# Patient Record
Sex: Male | Born: 1975 | Race: White | Hispanic: No | Marital: Married | Smoking: Never smoker
Health system: Southern US, Community
[De-identification: ages and names within clinical notes are randomized; demographics above are authoritative.]

## PROBLEM LIST (undated history)

## (undated) DIAGNOSIS — N200 Calculus of kidney: Secondary | ICD-10-CM

## (undated) DIAGNOSIS — G4730 Sleep apnea, unspecified: Secondary | ICD-10-CM

---

## 2015-07-07 ENCOUNTER — Encounter (HOSPITAL_COMMUNITY): Payer: Self-pay

## 2015-07-07 ENCOUNTER — Emergency Department (HOSPITAL_COMMUNITY)
Admission: EM | Admit: 2015-07-07 | Discharge: 2015-07-07 | Disposition: A | Discharge status: Home / Self Care | Payer: BLUE CROSS/BLUE SHIELD | Attending: Emergency Medicine | Admitting: Emergency Medicine

## 2015-07-07 ENCOUNTER — Emergency Department (HOSPITAL_COMMUNITY): Payer: BLUE CROSS/BLUE SHIELD

## 2015-07-07 DIAGNOSIS — N201 Calculus of ureter: Secondary | ICD-10-CM | POA: Insufficient documentation

## 2015-07-07 DIAGNOSIS — R109 Unspecified abdominal pain: Secondary | ICD-10-CM | POA: Diagnosis present

## 2015-07-07 DIAGNOSIS — Z87442 Personal history of urinary calculi: Secondary | ICD-10-CM | POA: Insufficient documentation

## 2015-07-07 DIAGNOSIS — Z8669 Personal history of other diseases of the nervous system and sense organs: Secondary | ICD-10-CM | POA: Diagnosis not present

## 2015-07-07 HISTORY — DX: Calculus of kidney: N20.0

## 2015-07-07 HISTORY — DX: Sleep apnea, unspecified: G47.30

## 2015-07-07 LAB — CBC WITH DIFFERENTIAL/PLATELET
Basophils Absolute: 0 10*3/uL (ref 0.0–0.1)
Basophils Relative: 0 %
EOS ABS: 0.3 10*3/uL (ref 0.0–0.7)
Eosinophils Relative: 3 %
HEMATOCRIT: 41.7 % (ref 39.0–52.0)
HEMOGLOBIN: 14 g/dL (ref 13.0–17.0)
LYMPHS ABS: 1.9 10*3/uL (ref 0.7–4.0)
Lymphocytes Relative: 16 %
MCH: 28.2 pg (ref 26.0–34.0)
MCHC: 33.6 g/dL (ref 30.0–36.0)
MCV: 84.1 fL (ref 78.0–100.0)
MONO ABS: 0.8 10*3/uL (ref 0.1–1.0)
MONOS PCT: 7 %
NEUTROS PCT: 74 %
Neutro Abs: 8.8 10*3/uL — ABNORMAL HIGH (ref 1.7–7.7)
Platelets: 307 10*3/uL (ref 150–400)
RBC: 4.96 MIL/uL (ref 4.22–5.81)
RDW: 12.9 % (ref 11.5–15.5)
WBC: 11.9 10*3/uL — ABNORMAL HIGH (ref 4.0–10.5)

## 2015-07-07 LAB — BASIC METABOLIC PANEL
Anion gap: 13 (ref 5–15)
BUN: 10 mg/dL (ref 6–20)
CALCIUM: 8.9 mg/dL (ref 8.9–10.3)
CHLORIDE: 104 mmol/L (ref 101–111)
CO2: 21 mmol/L — AB (ref 22–32)
CREATININE: 0.95 mg/dL (ref 0.61–1.24)
GFR calc non Af Amer: >60 mL/min (ref >60)
GLUCOSE: 114 mg/dL — AB (ref 65–99)
Potassium: 3.8 mmol/L (ref 3.5–5.1)
Sodium: 138 mmol/L (ref 135–145)

## 2015-07-07 LAB — URINALYSIS, ROUTINE W REFLEX MICROSCOPIC
BILIRUBIN URINE: NEGATIVE
GLUCOSE, UA: NEGATIVE mg/dL
HGB URINE DIPSTICK: NEGATIVE
KETONES UR: NEGATIVE mg/dL
Leukocytes, UA: NEGATIVE
NITRITE: NEGATIVE
PH: 6.5 (ref 5.0–8.0)
Protein, ur: NEGATIVE mg/dL
SPECIFIC GRAVITY, URINE: 1.016 (ref 1.005–1.030)

## 2015-07-07 MED ORDER — KETOROLAC TROMETHAMINE 30 MG/ML IJ SOLN
30.0000 mg | Freq: Once | INTRAMUSCULAR | Status: AC
  Administered 2015-07-07: 30 mg via INTRAVENOUS
  Filled 2015-07-07: qty 1

## 2015-07-07 MED ORDER — MORPHINE SULFATE (PF) 4 MG/ML IV SOLN
4.0000 mg | Freq: Once | INTRAVENOUS | Status: AC
  Administered 2015-07-07: 4 mg via INTRAVENOUS
  Filled 2015-07-07: qty 1

## 2015-07-07 MED ORDER — OXYCODONE-ACETAMINOPHEN 5-325 MG PO TABS: 1.0000 | ORAL_TABLET | ORAL | Status: DC | PRN

## 2015-07-07 MED ORDER — TAMSULOSIN HCL 0.4 MG PO CAPS: 0.4000 mg | ORAL_CAPSULE | Freq: Every day | ORAL | Status: DC

## 2015-07-07 NOTE — ED Notes (Signed)
Pt reports right sided flank pain with dysuria that began Thursday night.  Pt reports he has a hx of kidney stones and the symptoms feel similar.  Pt denies any hematuria at this time.

## 2015-07-07 NOTE — ED Notes (Signed)
Pt ambulated to the tteatment room with ease, no complaints. Pt changed into gown, and placed on monitor. Denny PeonErin, RN at bedside,

## 2015-07-07 NOTE — ED Provider Notes (Signed)
CSN: [id number]     Arrival date & time 07/07/15  0750 History   First MD Initiated Contact with Patient 07/07/15 [phone]     Chief Complaint  Patient presents with  . Nephrolithiasis     (Consider location/radiation/quality/duration/timing/severity/associated sxs/prior Treatment) HPI  40 year old male presents with right-sided back pain that started about 4 days ago. Started to radiate into the right groin and even into his right testicle. Started to feel better and thought that he was passing a kidney stone. However now the pain is worse since last night and still in his back and in his groin. Erler had testicular pain that is gone now. Has been having dysuria when this originally started, no dysuria for a couple days, and now dysuria when he most recently urinated in the ED. No hematuria. This feels very similar to when he passed a kidney stone about 1-2 months ago. Prior to this he had never had a kidney stone before. He never sought medical care but saw a stone pass out of his penis. No fevers. No nausea or vomiting. Has been taking ibuprofen with no relief. Pain is currently a 5-6.  Past Medical History  Diagnosis Date  . Sleep apnea   . Kidney stones    History reviewed. No pertinent past surgical history. History reviewed. No pertinent family history. Social History  Substance Use Topics  . Smoking status: Never Smoker   . Smokeless tobacco: None  . Alcohol Use: No    Review of Systems  Gastrointestinal: Positive for abdominal pain. Negative for nausea and vomiting.  Genitourinary: Positive for dysuria. Negative for hematuria.  Musculoskeletal: Positive for back pain.  All other systems reviewed and are negative.     Allergies  Review of patient's allergies indicates no known allergies.  Home Medications   Prior to Admission medications   Not on File   BP 160/93 mmHg  Pulse 78  Temp(Src) 97.8 F (36.6 C) (Oral)  Resp 18  Ht  (1.753 m)  Wt 215 lb (97.523 kg)   BMI 31.74 kg/m2  SpO2 98% Physical Exam  Constitutional: He is oriented to person, place, and time. He appears well-developed and well-nourished.  HENT:  Head: Normocephalic and atraumatic.  Right Ear: External ear normal.  Left Ear: External ear normal.  Nose: Nose normal.  Eyes: Right eye exhibits no discharge. Left eye exhibits no discharge.  Neck: Neck supple.  Cardiovascular: Normal rate, regular rhythm, normal heart sounds and intact distal pulses.   Pulmonary/Chest: Effort normal and breath sounds normal.  Abdominal: Soft. There is tenderness in the right lower quadrant. Hernia confirmed negative in the right inguinal area and confirmed negative in the left inguinal area.    Genitourinary: Penis normal. Right testis shows no swelling and no tenderness. Left testis shows no swelling and no tenderness.  Musculoskeletal: He exhibits no edema.       Thoracic back: He exhibits tenderness.       Back:  Neurological: He is alert and oriented to person, place, and time.  Skin: Skin is warm and dry.  Nursing note and vitals reviewed.   ED Course  Procedures (including critical care time) Labs Review Labs Reviewed  BASIC METABOLIC PANEL - Abnormal; Notable for the following:    CO2 21 (*)    Glucose, Bld 114 (*)    All other components within normal limits  CBC WITH DIFFERENTIAL/[id number] Abnormal; Notable for the following:    WBC 11.9 (*)    Neutro Abs  8.8 (*)    All other components within normal limits  URINALYSIS, ROUTINE W REFLEX MICROSCOPIC (NOT AT [facility])    Imaging Review Ct Renal Stone Study  07/07/2015  CLINICAL DATA:  RT FLANK PAIN, GROIN PRESSURE AND PAINFUL URINATION SINCE Thursday PAIN WORSENED THIS AM @ 5A H/O STONES IN PAST EXAM: CT ABDOMEN AND PELVIS WITHOUT CONTRAST TECHNIQUE: Multidetector CT imaging of the abdomen and pelvis was performed following the standard protocol without IV contrast. COMPARISON:  None. FINDINGS: Lower chest: No pulmonary nodules,  pleural effusions, or infiltrates. Heart size is normal. No imaged pericardial effusion or significant coronary artery calcifications. Upper abdomen: There is right-sided hydronephrosis and hydroureter secondary to a calculus in the distal right ureter which measures 5 mm in diameter. Calculus is approximately 5 cm proximal to the bladder. Multiple intrarenal stones are also identified in the right kidney, measuring 1-2 mm in diameter. The left kidney has a normal appearance. No intrarenal or ureteral stones are identified. No focal abnormality identified within the liver. Liver is diffusely low attenuation consistent with steatosis. No focal abnormality identified within the spleen, pancreas, or adrenal glands. Gallbladder is present. Gastrointestinal tract: The stomach and small bowel loops are normal in appearance. Colonic loops are normal in appearance. The appendix is well seen and has a normal appearance. Pelvis: Urinary bladder, prostate gland, and seminal vesicles are normal in appearance. No free pelvic fluid. Retroperitoneum: No evidence for aortic aneurysm. No retroperitoneal or mesenteric adenopathy. Abdominal wall: Unremarkable. Osseous structures: Unremarkable. IMPRESSION: 1. Right distal ureteral stone measuring 5 mm associated with right-sided hydronephrosis. 2. Multiple intrarenal calculi in the right kidney. 3. Hepatic steatosis. 4. Normal appendix. Electronically Signed   By: Norva PavlovElizabeth  Brown M.D.   On: 07/07/2015 09:44   I have personally reviewed and evaluated these images and lab results as part of my medical decision-making.   EKG Interpretation None      MDM   Final diagnoses:  Right ureteral stone    CT shows right ureteral stone. No evidence of UTI or acute infection. Appears comfortable and wants to go home. Given IV Toradol as well. Will start on Flomax, given a strainer, and treat with narcotics and continued ibuprofen at home. Follow-up with urology. Discussed strict  return precautions.    Pricilla LovelessScott Trella Thurmond, MD 07/07/15 1110

## 2016-12-07 IMAGING — CT CT RENAL STONE PROTOCOL
2 of 4 series · 16 of 46 positions shown, 18 images · non-contrast
Comparison: None.

CLINICAL DATA: RT FLANK PAIN, GROIN PRESSURE AND PAINFUL URINATION
SINCE [REDACTED] PAIN WORSENED THIS AM @ 5A H/O STONES IN PAST

EXAM:
CT ABDOMEN AND PELVIS WITHOUT CONTRAST
TECHNIQUE: Multidetector CT imaging of the abdomen and pelvis was performed
following the standard protocol without IV contrast.

[Series 2: renal stone 5mm · axial · 0.82mm/px · z∈[-893,-483]mm · 13 of 94 slices shown, 15 images]
[im 6/94  soft-tissue]
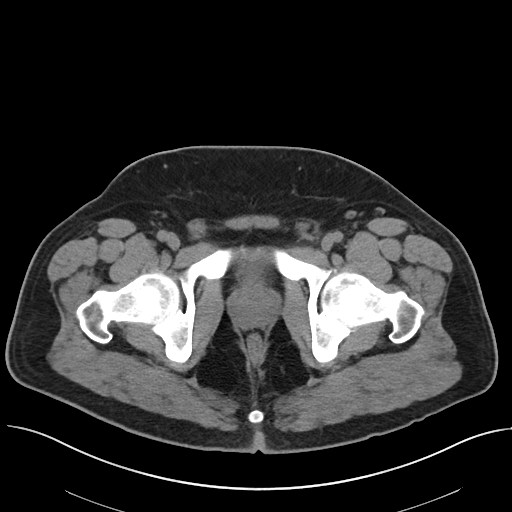
[im 6/94  bone]
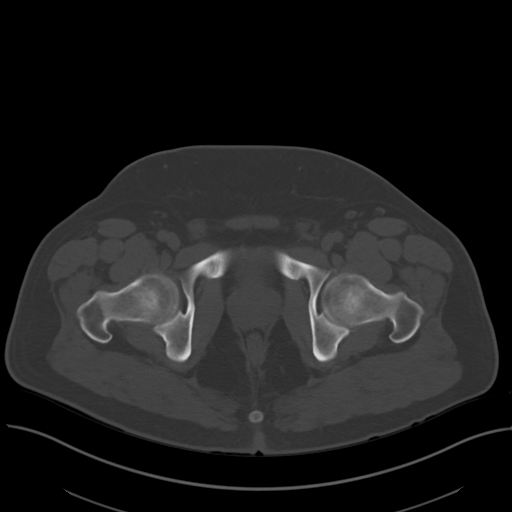
[im 12/94  soft-tissue]
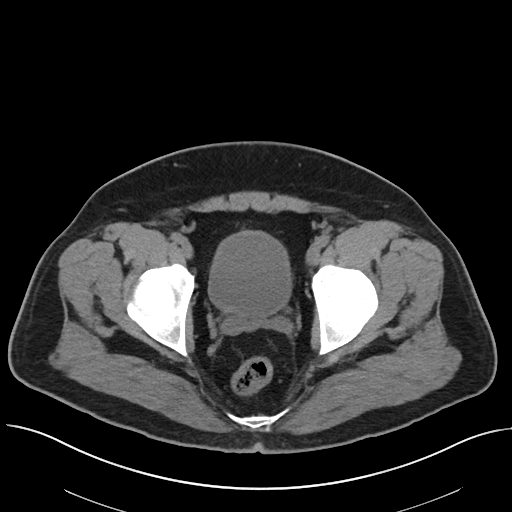
[im 18/94  soft-tissue]
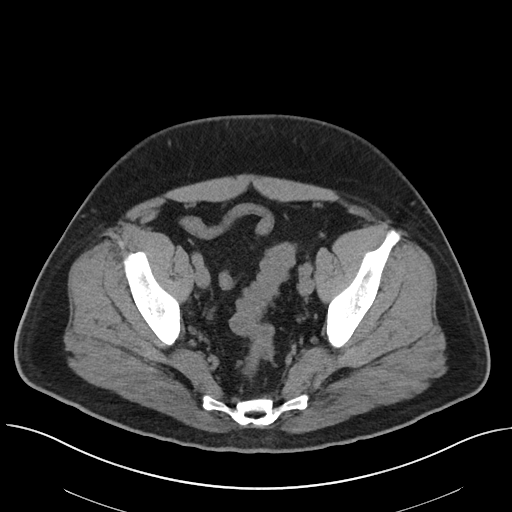
[im 30/94  soft-tissue]
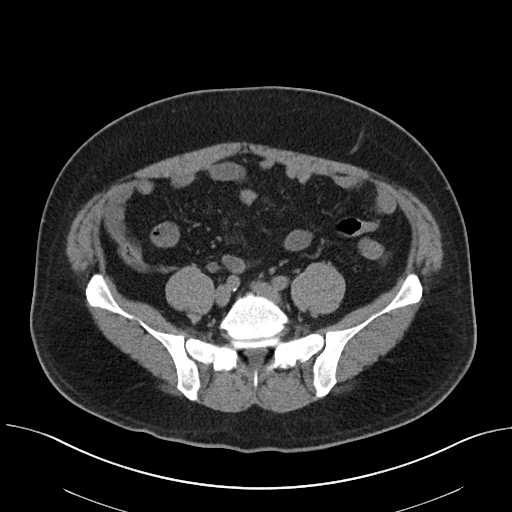
[im 35/94  soft-tissue]
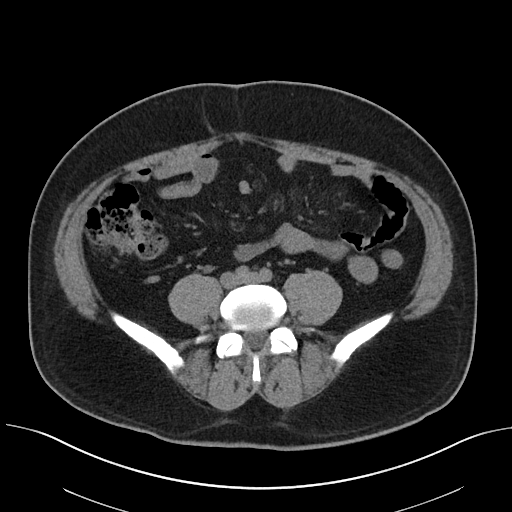
[im 41/94  soft-tissue]
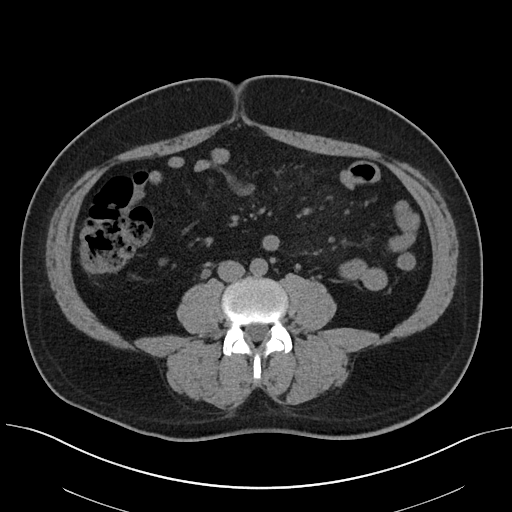
[im 47/94  soft-tissue]
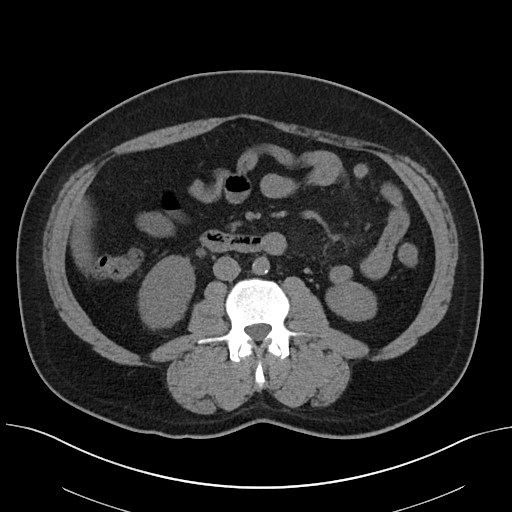
[im 53/94  soft-tissue]
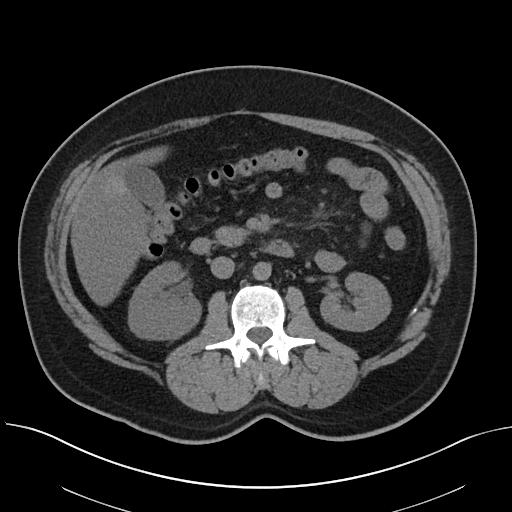
[im 59/94  soft-tissue]
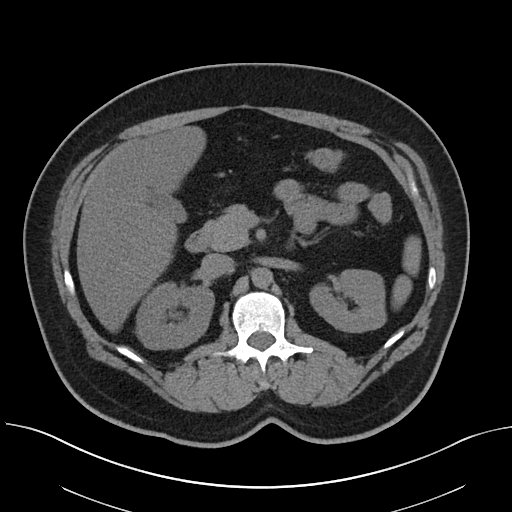
[im 59/94  bone]
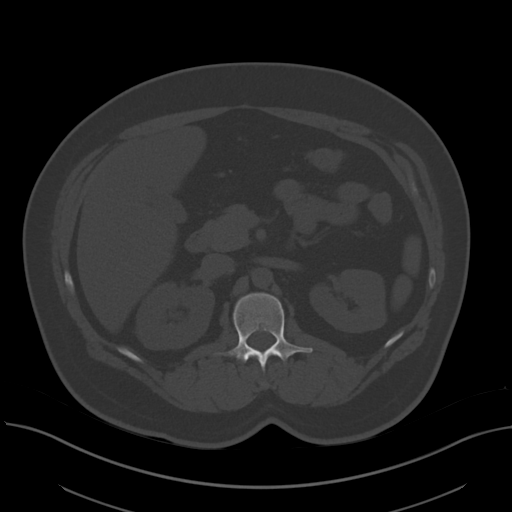
[im 64/94  soft-tissue]
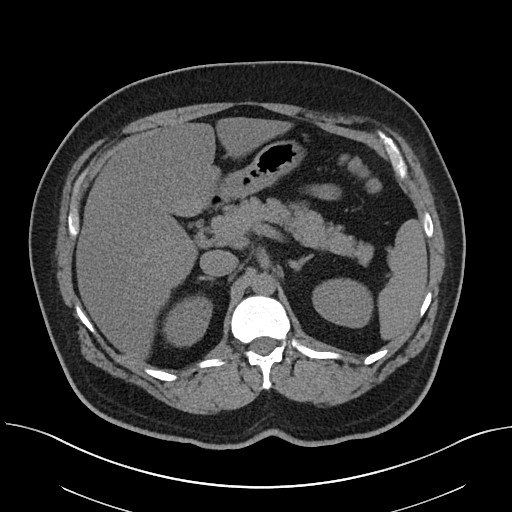
[im 76/94  soft-tissue]
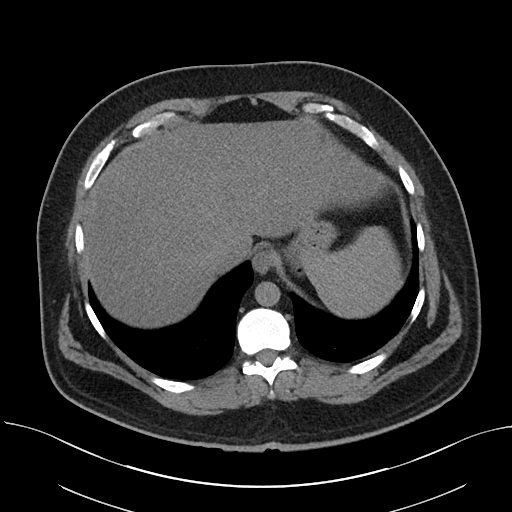
[im 82/94  soft-tissue]
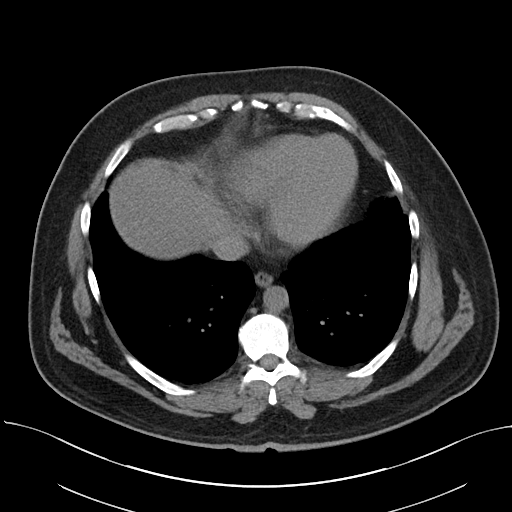
[im 88/94  soft-tissue]
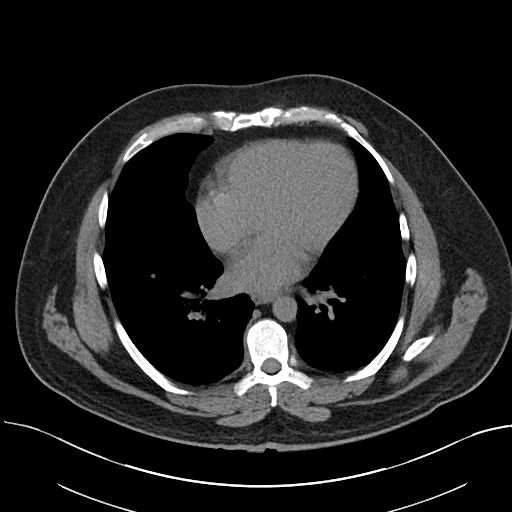

[Series 4: renal stone 3.0 cor · coronal · 0.78mm/px · 3 of 100 slices shown]
[im 34/100  soft-tissue]
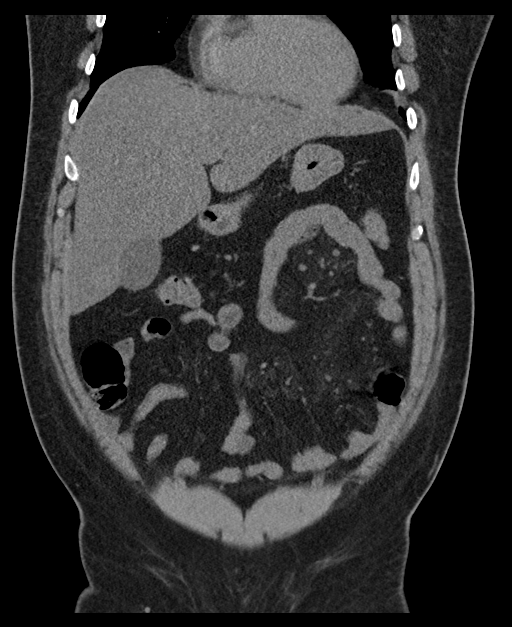
[im 45/100  soft-tissue]
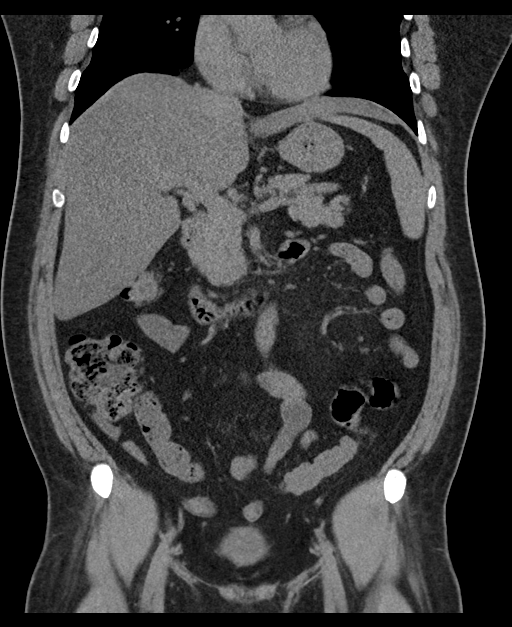
[im 56/100  soft-tissue]
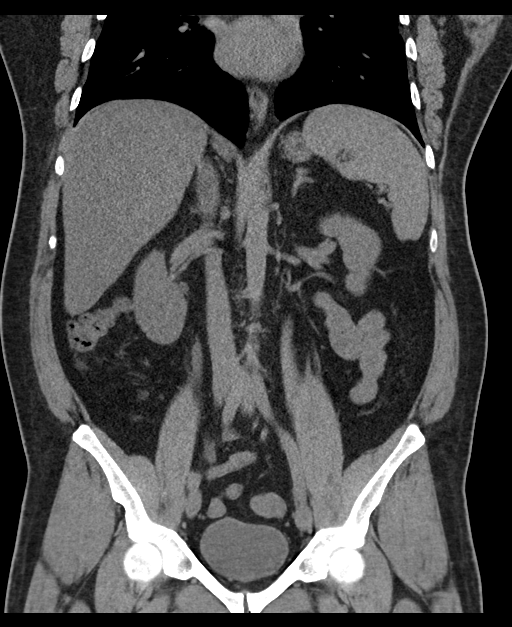

[16 of 46 positions shown; findings below may reference images not displayed]

FINDINGS: Lower chest: No pulmonary nodules, pleural effusions, or
infiltrates. Heart size is normal. No imaged pericardial effusion or
significant coronary artery calcifications.

Upper abdomen: There is right-sided hydronephrosis and hydroureter
secondary to a calculus in the distal right ureter which measures 5
mm in diameter. Calculus is approximately 5 cm proximal to the
bladder. Multiple intrarenal stones are also identified in the right
kidney, measuring 1-2 mm in diameter.

The left kidney has a normal appearance. No intrarenal or ureteral
stones are identified.

No focal abnormality identified within the liver. Liver is diffusely
low attenuation consistent with steatosis. No focal abnormality
identified within the spleen, pancreas, or adrenal glands.
Gallbladder is present.

Gastrointestinal tract: The stomach and small bowel loops are normal
in appearance. Colonic loops are normal in appearance. The appendix
is well seen and has a normal appearance.

Pelvis: Urinary bladder, prostate gland, and seminal vesicles are
normal in appearance. No free pelvic fluid.

Retroperitoneum: No evidence for aortic aneurysm. No retroperitoneal
or mesenteric adenopathy.

Abdominal wall: Unremarkable.

Osseous structures: Unremarkable.
IMPRESSION: 1. Right distal ureteral stone measuring 5 mm associated with
right-sided hydronephrosis.
2. Multiple intrarenal calculi in the right kidney.
3. Hepatic steatosis.
4. Normal appendix.

## 2024-02-07 ENCOUNTER — Other Ambulatory Visit (HOSPITAL_BASED_OUTPATIENT_CLINIC_OR_DEPARTMENT_OTHER): Payer: Self-pay | Admitting: Nurse Practitioner

## 2024-02-07 DIAGNOSIS — E785 Hyperlipidemia, unspecified: Secondary | ICD-10-CM

## 2024-02-08 ENCOUNTER — Ambulatory Visit (HOSPITAL_BASED_OUTPATIENT_CLINIC_OR_DEPARTMENT_OTHER)
Admission: RE | Admit: 2024-02-08 | Discharge: 2024-02-08 | Disposition: A | Discharge status: Home / Self Care | Payer: Self-pay | Source: Ambulatory Visit | Attending: Nurse Practitioner | Admitting: Nurse Practitioner

## 2024-02-08 DIAGNOSIS — E785 Hyperlipidemia, unspecified: Secondary | ICD-10-CM

## 2024-02-28 ENCOUNTER — Ambulatory Visit: Attending: Cardiovascular Disease | Admitting: Cardiovascular Disease

## 2024-02-28 ENCOUNTER — Encounter: Payer: Self-pay | Admitting: Cardiovascular Disease

## 2024-02-28 VITALS — BP 132/82 | HR 84 | Ht 69.0 in | Wt 227.6 lb

## 2024-02-28 DIAGNOSIS — R0789 Other chest pain: Secondary | ICD-10-CM | POA: Diagnosis not present

## 2024-02-28 DIAGNOSIS — Z01812 Encounter for preprocedural laboratory examination: Secondary | ICD-10-CM

## 2024-02-28 LAB — BASIC METABOLIC PANEL WITH GFR
BUN/Creatinine Ratio: 12 (ref 9–20)
BUN: 11 mg/dL (ref 6–24)
CO2: 21 mmol/L (ref 20–29)
Calcium: 9.9 mg/dL (ref 8.7–10.2)
Chloride: 100 mmol/L (ref 96–106)
Creatinine, Ser: 0.91 mg/dL (ref 0.76–1.27)
Glucose: 105 mg/dL — ABNORMAL HIGH (ref 70–99)
Potassium: 4.7 mmol/L (ref 3.5–5.2)
Sodium: 137 mmol/L (ref 134–144)
eGFR: 103 mL/min/1.73 (ref >59)

## 2024-02-28 LAB — CBC
Hematocrit: 48.1 % (ref 37.5–51.0)
Hemoglobin: 16 g/dL (ref 13.0–17.7)
MCH: 28.8 pg (ref 26.6–33.0)
MCHC: 33.3 g/dL (ref 31.5–35.7)
MCV: 87 fL (ref 79–97)
Platelets: 348 x10E3/uL (ref 150–450)
RBC: 5.56 x10E6/uL (ref 4.14–5.80)
RDW: 13.1 % (ref 11.6–15.4)
WBC: 14 x10E3/uL — ABNORMAL HIGH (ref 3.4–10.8)

## 2024-02-28 MED ORDER — NITROGLYCERIN 0.4 MG SL SUBL: 0.4000 mg | SUBLINGUAL_TABLET | SUBLINGUAL | 3 refills | Status: AC | PRN

## 2024-02-28 MED ORDER — METOPROLOL SUCCINATE ER 25 MG PO TB24: 25.0000 mg | ORAL_TABLET | Freq: Every day | ORAL | 3 refills | Status: AC

## 2024-02-28 NOTE — H&P (View-Only) (Signed)
 Cardiology Office Note:    Date:  03/01/2024   ID:  Tyler Mcgrath, DOB [birth year], MRN [id number]  PCP:  Allen, Chad, NP   [facility] Providers Cardiologist:  None     Referring MD: Allen, Chad, NP   Chief Complaint  Patient presents with   Chest Pain    History of Present Illness:    Tyler Mcgrath is a 48 y.o. male here for evaluation of elevated coronary calcium  score and chest pain. He has 3 children, ages 59, 38, and 75. He has a family history of CAD. His mother had multivessel CABG in her 35's, sister had a heart attack at 24.   The patient had a recent CT coronary artery calcium  score which resulted at 427, placing him at 98th percentile for age, race, gender matched control.  The patient is here with his wife today. He reports that he's been going to the gym and doing aerobic activity and resistance exercises over the past few years. For the last 2 weeks, he has experienced dyspnea, chest discomfort, and weakness over the past few weeks and he feels like his symptoms are worsening over the past week. He describes his chest discomfort as a pressure and tightness between the shoulder blades and in the center of the chest, brought on by exertion, and relieved by rest. He has not taken sublingual NTG (does not have). Symptoms resolve within 5-10 minutes of rest. No resting angina at this point. He reports more frequent symptoms in the last week. No resting dyspnea, orthopnea, PND, or near-syncope. The patient has reduced his physical activity due to the presence of chest pressure.   Past Medical History:  Diagnosis Date   Kidney stones    Sleep apnea    History reviewed. No pertinent surgical history.  Current Medications: Active Medications[1]   Allergies:   Patient has no known allergies.   ROS:   Please see the history of present illness.    All other systems reviewed and are negative.  EKGs/Labs/Other Studies Reviewed:    The following studies were  reviewed today: Cardiac Studies & Procedures   ______________________________________________________________________________________________          CT SCANS  CT CARDIAC SCORING (SELF PAY ONLY) 02/08/2024  Addendum 02/11/2024  3:19 AM ADDENDUM REPORT: 02/11/2024 03:17  EXAM: OVER-READ INTERPRETATION  CT CHEST  The following report is an over-read performed by radiologist Dr. Oneil Devonshire of [facility] on 02/11/2024. This over-read does not include interpretation of cardiac or coronary anatomy or pathology. The coronary calcium  score interpretation by the cardiologist is attached.  COMPARISON:  None.  FINDINGS: Cardiovascular: There are no significant extracardiac vascular findings.  Mediastinum/Nodes: There are no enlarged lymph nodes within the visualized mediastinum.  Lungs/Pleura: There is no pleural effusion. The visualized lungs appear clear.  Upper abdomen: No significant findings in the visualized upper abdomen.  Musculoskeletal/Chest wall: No chest wall mass or suspicious osseous findings within the visualized chest.  IMPRESSION: No significant extracardiac findings within the visualized chest.   Electronically Signed By: Oneil Devonshire M.D. On: 02/11/2024 03:17  Narrative : CLINICAL DATA:  Cardiovascular Disease Risk stratification  EXAM:  Coronary Calcium  Score  TECHNIQUE:  A gated, non-contrast computed tomography scan of the heart was  performed using 3mm slice thickness. Axial images were analyzed on a  dedicated workstation. Calcium  scoring of the coronary arteries was  performed using the Agatston method.  FINDINGS:  Coronary Calcium  Score:  Left main: 0  Left anterior  descending artery: 291  Left circumflex artery: 22  Right coronary artery: 115  Total: 427  Pericardium: Normal.  Ascending Aorta: Normal caliber.  Pulmonary artery: Normal caliber  Non-cardiac: See separate report from [facility].  IMPRESSION:  Coronary calcium  score of 427. This was 62 percentile for age-, race-,  and sex-matched controls.  RECOMMENDATIONS:  Coronary artery calcium  (CAC) score is a strong predictor of  incident coronary heart disease (CHD) and provides predictive  information beyond traditional risk factors. CAC scoring is  reasonable to use in the decision to withhold, postpone, or initiate  statin therapy in intermediate-risk or selected borderline-risk  asymptomatic adults (age 2-75 years and LDL-C >=70 to <190 mg/dL)  who do not have diabetes or established atherosclerotic  cardiovascular disease (ASCVD).* In intermediate-risk (10-year ASCVD  risk >=7.5% to <20%) adults or selected borderline-risk (10-year  ASCVD risk >=5% to <7.5%) adults in whom a CAC score is measured for  the purpose of making a treatment decision the following  recommendations have been made:  If CAC=0, it is reasonable to withhold statin therapy and reassess  in 5 to 10 years, as long as higher risk conditions are absent  (diabetes mellitus, family history of premature CHD in first degree  relatives (males <55 years; females <65 years), cigarette smoking,  or LDL >=190 mg/dL).  If CAC is 1 to 99, it is reasonable to initiate statin therapy for  patients >=57 years of age.  If CAC is >=100 or >=75th percentile, it is reasonable to initiate  statin therapy at any age.  Cardiology referral should be considered for patients with CAC  scores >=400 or >=75th percentile.  *2018 AHA/ACC/AACVPR/AAPA/ABC/ACPM/ADA/AGS/APhA/ASPC/NLA/PCNA  Guideline on the Management of Blood Cholesterol: A Report of the  American College of Cardiology/American Heart Association Task Force  on Clinical Practice Guidelines. J Am Coll Cardiol.  2019;73(24):3168-3209.  Electronically Signed: By: Lamar Fitch M.D. On: 02/09/2024 17:46      ______________________________________________________________________________________________      EKG:   EKG Interpretation Date/Time:  Monday February 28 2024 10:03:44 EST Ventricular Rate:  84 PR Interval:  148 QRS Duration:  74 QT Interval:  356 QTC Calculation: 420 R Axis:   43  Text Interpretation: Normal sinus rhythm Normal ECG No previous ECGs available Confirmed by Wonda Sharper [id number]) on 02/28/2024 10:12:34 AM    Recent Labs: 02/28/2024: BUN 11; Creatinine, Ser 0.91; Hemoglobin 16.0; Platelets 348; Potassium 4.7; Sodium 137  Recent Lipid Panel No results found for: CHOL, TRIG, HDL, CHOLHDL, VLDL, LDLCALC, LDLDIRECT          Physical Exam:    VS:  BP 132/82   Pulse 84   Ht 5' 9 (1.753 m)   Wt 227 lb 9.6 oz (103.2 kg)   SpO2 97%   BMI 33.61 kg/m     Wt Readings from Last 3 Encounters:  02/28/24 227 lb 9.6 oz (103.2 kg)  07/07/15 215 lb (97.5 kg)     GEN:  Well nourished, well developed in no acute distress HEENT: Normal NECK: No JVD; No carotid bruits LYMPHATICS: No lymphadenopathy CARDIAC: RRR, no murmurs, rubs, gallops RESPIRATORY:  Clear to auscultation without rales, wheezing or rhonchi  ABDOMEN: Soft, non-tender, non-distended MUSCULOSKELETAL:  No edema; No deformity  SKIN: Warm and dry NEUROLOGIC:  Alert and oriented x 3 PSYCHIATRIC:  Normal affect   Assessment & Plan Unstable angina (HCC) The patient has new onset exertional chest pressure, relieved by rest, by definition unstable angina. Symptoms have worsened over  the last 2 weeks. His underlying CV risk factors include family history of CAD, HTN, hyperlipidemia, and elevated coronary calcium  score >400 (98th percentile). With classic angina of new onset and worsening symptoms, I have recommended cardiac catheterization and possible PCI. The patient is currently treated with ASA, a high-intensity statin, and amlodipine . I have added metoprolol  succinate 25 mg daily and  also sublingual NTG as needed. He understands to call EMS if rest pain, or NTG-refractory chest pain occurs. ER precautions given. I have reviewed the risks, indications, and alternatives to cardiac catheterization, possible angioplasty, and stenting with the patient. Risks include but are not limited to bleeding, infection, vascular injury, stroke, myocardial infection, arrhythmia, kidney injury, radiation-related injury in the case of prolonged fluoroscopy use, emergency cardiac surgery, and death. The patient understands the risks of serious complication is 1-2 in 1000 with diagnostic cardiac cath and 1-2% or less with angioplasty/stenting.   Pre-procedure lab exam Obtain pre-procedure lab work.        Informed Consent   Shared Decision Making/Informed Consent The risks [stroke (1 in 1000), death (1 in 1000), kidney failure [usually temporary] (1 in 500), bleeding (1 in 200), allergic reaction [possibly serious] (1 in 200)], benefits (diagnostic support and management of coronary artery disease) and alternatives of a cardiac catheterization were discussed in detail with Tyler Mcgrath and he is willing to proceed.       Medication Adjustments/Labs and Tests Ordered: Current medicines are reviewed at length with the patient today.  Concerns regarding medicines are outlined above.  Orders Placed This Encounter  Procedures   CBC   Basic metabolic panel with GFR   EKG [id number]   Meds ordered this encounter  Medications   nitroGLYCERIN  (NITROSTAT ) 0.4 MG SL tablet    Sig: Place 1 tablet (0.4 mg total) under the tongue every 5 (five) minutes as needed for chest pain. The proper use and anticipated side effects of nitroglycerine has been carefully explained.  If a single episode of chest pain is not relieved by one tablet, the patient will try another within 5 minutes; and if this doesn't relieve the pain, the patient is instructed to call 911 for transportation to an emergency department.     Dispense:  30 tablet    Refill:  3   metoprolol  succinate (TOPROL -XL) 25 MG 24 hr tablet    Sig: Take 1 tablet (25 mg total) by mouth daily. Take with or immediately following a meal.    Dispense:  30 tablet    Refill:  3    Patient Instructions  Medication Instructions:  START Metoprolol  Succinate (Toprol -XL) 25 mg once daily  AS NEEDED Nitroglycerin  0.4 mg SL for chest pain The proper use and anticipated side effects of nitroglycerine has been carefully explained.  If a single episode of chest pain is not relieved by one tablet, the patient will try another within 5 minutes; and if this doesn't relieve the pain, the patient is instructed to call 911 for transportation to an emergency department.  *If you need a refill on your cardiac medications before your next appointment, please call your pharmacy*  Lab Work: To be completed today: CBC and BMP  If you have labs (blood work) drawn today and your tests are completely normal, you will receive your results only by: MyChart Message (if you have MyChart) OR A paper copy in the mail If you have any lab test that is abnormal or we need to change your treatment, we will call you to  review the results.  Testing/Procedures: .Your physician has requested that you have a cardiac catheterization. Cardiac catheterization is used to diagnose and/or treat various heart conditions. Doctors may recommend this procedure for a number of different reasons. The most common reason is to evaluate chest pain. Chest pain can be a symptom of coronary artery disease (CAD), and cardiac catheterization can show whether plaque is narrowing or blocking your hearts arteries. This procedure is also used to evaluate the valves, as well as measure the blood flow and oxygen levels in different parts of your heart. For further information please visit [email or web address] Please follow instruction sheet, as given.   Follow-Up: At [facility], you and your  health needs are our priority.  As part of our continuing mission to provide you with exceptional heart care, our providers are all part of one team.  This team includes your primary Cardiologist (physician) and Advanced Practice Providers or APPs (Physician Assistants and Nurse Practitioners) who all work together to provide you with the care you need, when you need it.  Your next appointment:   3-4 week(s)  Provider:   One of our Advanced Practice Providers (APPs): Morse Clause, PA-C  Lamarr Satterfield, NP Miriam Shams, NP  Olivia Pavy, PA-C Josefa Beauvais, NP  Leontine Salen, PA-C Orren Fabry, PA-C  Hao Meng, PA-C Ernest Dick, NP  Damien Braver, NP Jon Hails, PA-C  Waddell Donath, PA-C    Dayna Dunn, PA-C  Scott Weaver, PA-C Lum Louis, NP Katlyn West, NP Callie Goodrich, PA-C  Xika Zhao, NP Sheng Haley, PA-C    Kathleen Johnson, PA-C    We recommend signing up for the patient portal called MyChart.  Sign up information is provided on this After Visit Summary.  MyChart is used to connect with patients for Virtual Visits (Telemedicine).  Patients are able to view lab/test results, encounter notes, upcoming appointments, etc.  Non-urgent messages can be sent to your provider as well.   To learn more about what you can do with MyChart, go to [email or web address].   Other Instructions       Cardiac/Peripheral Catheterization   You are scheduled for a Cardiac Catheterization on Thursday, December 18 with Dr. [name].  1. Please arrive at the [facility] (Main Entrance A[facility] at [facility]: [address] [place], [place] [address] at 9:00 AM (This time is 2 hour(s) before your procedure to ensure your preparation). Your procedure is scheduled to begin at 11 AM.  Free valet parking service is available. You will check in at ADMITTING. The support person will be asked to wait in the waiting room.  It is OK to have someone drop you off and come back when you are  ready to be discharged.        Special note: Every effort is made to have your procedure done on time. Please understand that emergencies sometimes delay scheduled procedures.  2. Diet: Nothing to eat after midnight.  3. Hydration:You need to be well hydrated before your procedure. On December 18, you may drink approved liquids (see below) until 2 hours before the procedure, with 16 oz of water  as your last intake.   List of approved liquids water , clear juice, clear tea, black coffee, fruit juices, non-citric and without pulp, carbonated beverages, Gatorade, Kool -Aid, plain Jello-O and plain ice popsicles.  4. Labs: You will need to have blood drawn on Monday, December 15 at [facility] (1st Floor),  [address] [place], [place] [address]. You do not need to be fasting.  5. Medication instructions in preparation for your procedure:   Contrast Allergy: No  On the morning of your procedure, take Aspirin  81 mg and any morning medicines NOT listed above.  You may use sips of water .  6. Plan to go home the same day, you will only stay overnight if medically necessary. 7. You MUST have a responsible adult to drive you home. 8. An adult MUST be with you the first 24 hours after you arrive home. 9. Bring a current list of your medications, and the last time and date medication taken. 10. Bring ID and current insurance cards. 11.Please wear clothes that are easy to get on and off and wear slip-on shoes.  Thank you for allowing us  to care for you!   -- [facility]     Signed, [name], MD  03/01/2024 5:55 AM     [facility]     [1]  Current Meds  Medication Sig   amLODipine  (NORVASC ) 5 MG tablet Take 5 mg by mouth at bedtime.   aspirin  EC 81 MG tablet Take 81 mg by mouth at bedtime. Swallow whole.   metoprolol  succinate (TOPROL -XL) 25 MG 24 hr tablet Take 1 tablet (25 mg total) by mouth  daily. Take with or immediately following a meal. (Patient taking differently: Take 25 mg by mouth daily with supper. Take with or immediately following a meal.)   nitroGLYCERIN  (NITROSTAT ) 0.4 MG SL tablet Place 1 tablet (0.4 mg total) under the tongue every 5 (five) minutes as needed for chest pain. The proper use and anticipated side effects of nitroglycerine has been carefully explained.  If a single episode of chest pain is not relieved by one tablet, the patient will try another within 5 minutes; and if this doesn't relieve the pain, the patient is instructed to call 911 for transportation to an emergency department.   rosuvastatin  (CRESTOR ) 40 MG tablet Take 40 mg by mouth at bedtime.

## 2024-02-28 NOTE — Patient Instructions (Signed)
 Medication Instructions:  START Metoprolol  Succinate (Toprol -XL) 25 mg once daily  AS NEEDED Nitroglycerin  0.4 mg SL for chest pain The proper use and anticipated side effects of nitroglycerine has been carefully explained.  If a single episode of chest pain is not relieved by one tablet, the patient will try another within 5 minutes; and if this doesn't relieve the pain, the patient is instructed to call 911 for transportation to an emergency department.  *If you need a refill on your cardiac medications before your next appointment, please call your pharmacy*  Lab Work: To be completed today: CBC and BMP  If you have labs (blood work) drawn today and your tests are completely normal, you will receive your results only by: MyChart Message (if you have MyChart) OR A paper copy in the mail If you have any lab test that is abnormal or we need to change your treatment, we will call you to review the results.  Testing/Procedures: .Your physician has requested that you have a cardiac catheterization. Cardiac catheterization is used to diagnose and/or treat various heart conditions. Doctors may recommend this procedure for a number of different reasons. The most common reason is to evaluate chest pain. Chest pain can be a symptom of coronary artery disease (CAD), and cardiac catheterization can show whether plaque is narrowing or blocking your hearts arteries. This procedure is also used to evaluate the valves, as well as measure the blood flow and oxygen levels in different parts of your heart. For further information please visit [email or web address] Please follow instruction sheet, as given.   Follow-Up: At [facility], you and your health needs are our priority.  As part of our continuing mission to provide you with exceptional heart care, our providers are all part of one team.  This team includes your primary Cardiologist (physician) and Advanced Practice Providers or APPs (Physician  Assistants and Nurse Practitioners) who all work together to provide you with the care you need, when you need it.  Your next appointment:   3-4 week(s)  Provider:   One of our Advanced Practice Providers (APPs): Morse Clause, PA-C  Lamarr Satterfield, NP Miriam Shams, NP  Olivia Pavy, PA-C Josefa Beauvais, NP  Leontine Salen, PA-C Orren Fabry, PA-C  Hao Meng, PA-C Ernest Dick, NP  Damien Braver, NP Jon Hails, PA-C  Waddell Donath, PA-C    Dayna Dunn, PA-C  Scott Weaver, PA-C Lum Louis, NP Katlyn West, NP Callie Goodrich, PA-C  Xika Zhao, NP Sheng Haley, PA-C    Kathleen Johnson, PA-C    We recommend signing up for the patient portal called MyChart.  Sign up information is provided on this After Visit Summary.  MyChart is used to connect with patients for Virtual Visits (Telemedicine).  Patients are able to view lab/test results, encounter notes, upcoming appointments, etc.  Non-urgent messages can be sent to your provider as well.   To learn more about what you can do with MyChart, go to [email or web address].   Other Instructions       Cardiac/Peripheral Catheterization   You are scheduled for a Cardiac Catheterization on Thursday, December 18 with Dr. [name].  1. Please arrive at the [facility] (Main Entrance A) at [facility]: [address] [place], [place] [address] at 9:00 AM (This time is 2 hour(s) before your procedure to ensure your preparation). Your procedure is scheduled to begin at 11 AM.  Free valet parking service is available. You will check in at ADMITTING. The support person  will be asked to wait in the waiting room.  It is OK to have someone drop you off and come back when you are ready to be discharged.        Special note: Every effort is made to have your procedure done on time. Please understand that emergencies sometimes delay scheduled procedures.  2. Diet: Nothing to eat after midnight.  3. Hydration:You need to be well  hydrated before your procedure. On December 18, you may drink approved liquids (see below) until 2 hours before the procedure, with 16 oz of water  as your last intake.   List of approved liquids water , clear juice, clear tea, black coffee, fruit juices, non-citric and without pulp, carbonated beverages, Gatorade, Kool -Aid, plain Jello-O and plain ice popsicles.  4. Labs: You will need to have blood drawn on Monday, December 15 at [facility] (1st Floor), [address], [place], [place] [address]. You do not need to be fasting.  5. Medication instructions in preparation for your procedure:   Contrast Allergy: No  On the morning of your procedure, take Aspirin  81 mg and any morning medicines NOT listed above.  You may use sips of water .  6. Plan to go home the same day, you will only stay overnight if medically necessary. 7. You MUST have a responsible adult to drive you home. 8. An adult MUST be with you the first 24 hours after you arrive home. 9. Bring a current list of your medications, and the last time and date medication taken. 10. Bring ID and current insurance cards. 11.Please wear clothes that are easy to get on and off and wear slip-on shoes.  Thank you for allowing us  to care for you!   -- [facility]

## 2024-02-28 NOTE — Progress Notes (Unsigned)
 Cardiology Office Note:    Date:  03/01/2024   ID:  Tyler Mcgrath, DOB 1975/09/24, MRN [id number]  PCP:  Allen, Chad, NP   [facility] Providers Cardiologist:  None     Referring MD: Allen, Chad, NP   Chief Complaint  Patient presents with   Chest Pain    History of Present Illness:    Tyler Mcgrath is a 48 y.o. male here for evaluation of elevated coronary calcium  score and chest pain. He has 3 children, ages 51, 70, and 25. He has a family history of CAD. His mother had multivessel CABG in her 56's, sister had a heart attack at 71.   The patient had a recent CT coronary artery calcium  score which resulted at 427, placing him at 98th percentile for age, race, gender matched control.  The patient is here with his wife today. He reports that he's been going to the gym and doing aerobic activity and resistance exercises over the past few years. For the last 2 weeks, he has experienced dyspnea, chest discomfort, and weakness over the past few weeks and he feels like his symptoms are worsening over the past week. He describes his chest discomfort as a pressure and tightness between the shoulder blades and in the center of the chest, brought on by exertion, and relieved by rest. He has not taken sublingual NTG (does not have). Symptoms resolve within 5-10 minutes of rest. No resting angina at this point. He reports more frequent symptoms in the last week. No resting dyspnea, orthopnea, PND, or near-syncope. The patient has reduced his physical activity due to the presence of chest pressure.   Past Medical History:  Diagnosis Date   Kidney stones    Sleep apnea    History reviewed. No pertinent surgical history.  Current Medications: Active Medications[1]   Allergies:   Patient has no known allergies.   ROS:   Please see the history of present illness.    All other systems reviewed and are negative.  EKGs/Labs/Other Studies Reviewed:    The following studies were  reviewed today: Cardiac Studies & Procedures   ______________________________________________________________________________________________          CT SCANS  CT CARDIAC SCORING (SELF PAY ONLY) 02/08/2024  Addendum 02/11/2024  3:19 AM ADDENDUM REPORT: 02/11/2024 03:17  EXAM: OVER-READ INTERPRETATION  CT CHEST  The following report is an over-read performed by radiologist Dr. Oneil Devonshire of [facility] on 02/11/2024. This over-read does not include interpretation of cardiac or coronary anatomy or pathology. The coronary calcium  score interpretation by the cardiologist is attached.  COMPARISON:  None.  FINDINGS: Cardiovascular: There are no significant extracardiac vascular findings.  Mediastinum/Nodes: There are no enlarged lymph nodes within the visualized mediastinum.  Lungs/Pleura: There is no pleural effusion. The visualized lungs appear clear.  Upper abdomen: No significant findings in the visualized upper abdomen.  Musculoskeletal/Chest wall: No chest wall mass or suspicious osseous findings within the visualized chest.  IMPRESSION: No significant extracardiac findings within the visualized chest.   Electronically Signed By: Oneil Devonshire M.D. On: 02/11/2024 03:17  Narrative : CLINICAL DATA:  Cardiovascular Disease Risk stratification  EXAM:  Coronary Calcium  Score  TECHNIQUE:  A gated, non-contrast computed tomography scan of the heart was  performed using 3mm slice thickness. Axial images were analyzed on a  dedicated workstation. Calcium  scoring of the coronary arteries was  performed using the Agatston method.  FINDINGS:  Coronary Calcium  Score:  Left main: 0  Left anterior  descending artery: 291  Left circumflex artery: 22  Right coronary artery: 115  Total: 427  Pericardium: Normal.  Ascending Aorta: Normal caliber.  Pulmonary artery: Normal caliber  Non-cardiac: See separate report from [facility].  IMPRESSION:  Coronary calcium  score of 427. This was 37 percentile for age-, race-,  and sex-matched controls.  RECOMMENDATIONS:  Coronary artery calcium  (CAC) score is a strong predictor of  incident coronary heart disease (CHD) and provides predictive  information beyond traditional risk factors. CAC scoring is  reasonable to use in the decision to withhold, postpone, or initiate  statin therapy in intermediate-risk or selected borderline-risk  asymptomatic adults (age 19-75 years and LDL-C >=70 to <190 mg/dL)  who do not have diabetes or established atherosclerotic  cardiovascular disease (ASCVD).* In intermediate-risk (10-year ASCVD  risk >=7.5% to <20%) adults or selected borderline-risk (10-year  ASCVD risk >=5% to <7.5%) adults in whom a CAC score is measured for  the purpose of making a treatment decision the following  recommendations have been made:  If CAC=0, it is reasonable to withhold statin therapy and reassess  in 5 to 10 years, as long as higher risk conditions are absent  (diabetes mellitus, family history of premature CHD in first degree  relatives (males <55 years; females <65 years), cigarette smoking,  or LDL >=190 mg/dL).  If CAC is 1 to 99, it is reasonable to initiate statin therapy for  patients >=34 years of age.  If CAC is >=100 or >=75th percentile, it is reasonable to initiate  statin therapy at any age.  Cardiology referral should be considered for patients with CAC  scores >=400 or >=75th percentile.  *2018 AHA/ACC/AACVPR/AAPA/ABC/ACPM/ADA/AGS/APhA/ASPC/NLA/PCNA  Guideline on the Management of Blood Cholesterol: A Report of the  American College of Cardiology/American Heart Association Task Force  on Clinical Practice Guidelines. J Am Coll Cardiol.  2019;73(24):3168-3209.  Electronically Signed: By: Lamar Fitch M.D. On: 02/09/2024 17:46      ______________________________________________________________________________________________      EKG:   EKG Interpretation Date/Time:  Monday February 28 2024 10:03:44 EST Ventricular Rate:  84 PR Interval:  148 QRS Duration:  74 QT Interval:  356 QTC Calculation: 420 R Axis:   43  Text Interpretation: Normal sinus rhythm Normal ECG No previous ECGs available Confirmed by Wonda Sharper [phone]) on 02/28/2024 10:12:34 AM    Recent Labs: 02/28/2024: BUN 11; Creatinine, Ser 0.91; Hemoglobin 16.0; Platelets 348; Potassium 4.7; Sodium 137  Recent Lipid Panel No results found for: CHOL, TRIG, HDL, CHOLHDL, VLDL, LDLCALC, LDLDIRECT          Physical Exam:    VS:  BP 132/82   Pulse 84   Ht 5' 9 (1.753 m)   Wt 227 lb 9.6 oz (103.2 kg)   SpO2 97%   BMI 33.61 kg/m     Wt Readings from Last 3 Encounters:  02/28/24 227 lb 9.6 oz (103.2 kg)  07/07/15 215 lb (97.5 kg)     GEN:  Well nourished, well developed in no acute distress HEENT: Normal NECK: No JVD; No carotid bruits LYMPHATICS: No lymphadenopathy CARDIAC: RRR, no murmurs, rubs, gallops RESPIRATORY:  Clear to auscultation without rales, wheezing or rhonchi  ABDOMEN: Soft, non-tender, non-distended MUSCULOSKELETAL:  No edema; No deformity  SKIN: Warm and dry NEUROLOGIC:  Alert and oriented x 3 PSYCHIATRIC:  Normal affect   Assessment & Plan Unstable angina (HCC) The patient has new onset exertional chest pressure, relieved by rest, by definition unstable angina. Symptoms have worsened over  the last 2 weeks. His underlying CV risk factors include family history of CAD, HTN, hyperlipidemia, and elevated coronary calcium  score >400 (98th percentile). With classic angina of new onset and worsening symptoms, I have recommended cardiac catheterization and possible PCI. The patient is currently treated with ASA, a high-intensity statin, and amlodipine . I have added metoprolol  succinate 25 mg daily and  also sublingual NTG as needed. He understands to call EMS if rest pain, or NTG-refractory chest pain occurs. ER precautions given. I have reviewed the risks, indications, and alternatives to cardiac catheterization, possible angioplasty, and stenting with the patient. Risks include but are not limited to bleeding, infection, vascular injury, stroke, myocardial infection, arrhythmia, kidney injury, radiation-related injury in the case of prolonged fluoroscopy use, emergency cardiac surgery, and death. The patient understands the risks of serious complication is 1-2 in 1000 with diagnostic cardiac cath and 1-2% or less with angioplasty/stenting.   Pre-procedure lab exam Obtain pre-procedure lab work.        Informed Consent   Shared Decision Making/Informed Consent The risks [stroke (1 in 1000), death (1 in 1000), kidney failure [usually temporary] (1 in 500), bleeding (1 in 200), allergic reaction [possibly serious] (1 in 200)], benefits (diagnostic support and management of coronary artery disease) and alternatives of a cardiac catheterization were discussed in detail with Mr. Oyama and he is willing to proceed.       Medication Adjustments/Labs and Tests Ordered: Current medicines are reviewed at length with the patient today.  Concerns regarding medicines are outlined above.  Orders Placed This Encounter  Procedures   CBC   Basic metabolic panel with GFR   EKG [id number]   Meds ordered this encounter  Medications   nitroGLYCERIN  (NITROSTAT ) 0.4 MG SL tablet    Sig: Place 1 tablet (0.4 mg total) under the tongue every 5 (five) minutes as needed for chest pain. The proper use and anticipated side effects of nitroglycerine has been carefully explained.  If a single episode of chest pain is not relieved by one tablet, the patient will try another within 5 minutes; and if this doesn't relieve the pain, the patient is instructed to call 911 for transportation to an emergency department.     Dispense:  30 tablet    Refill:  3   metoprolol  succinate (TOPROL -XL) 25 MG 24 hr tablet    Sig: Take 1 tablet (25 mg total) by mouth daily. Take with or immediately following a meal.    Dispense:  30 tablet    Refill:  3    Patient Instructions  Medication Instructions:  START Metoprolol  Succinate (Toprol -XL) 25 mg once daily  AS NEEDED Nitroglycerin  0.4 mg SL for chest pain The proper use and anticipated side effects of nitroglycerine has been carefully explained.  If a single episode of chest pain is not relieved by one tablet, the patient will try another within 5 minutes; and if this doesn't relieve the pain, the patient is instructed to call 911 for transportation to an emergency department.  *If you need a refill on your cardiac medications before your next appointment, please call your pharmacy*  Lab Work: To be completed today: CBC and BMP  If you have labs (blood work) drawn today and your tests are completely normal, you will receive your results only by: MyChart Message (if you have MyChart) OR A paper copy in the mail If you have any lab test that is abnormal or we need to change your treatment, we will call you to  review the results.  Testing/Procedures: .Your physician has requested that you have a cardiac catheterization. Cardiac catheterization is used to diagnose and/or treat various heart conditions. Doctors may recommend this procedure for a number of different reasons. The most common reason is to evaluate chest pain. Chest pain can be a symptom of coronary artery disease (CAD), and cardiac catheterization can show whether plaque is narrowing or blocking your hearts arteries. This procedure is also used to evaluate the valves, as well as measure the blood flow and oxygen levels in different parts of your heart. For further information please visit [email or web address] Please follow instruction sheet, as given.   Follow-Up: At [facility], you and your  health needs are our priority.  As part of our continuing mission to provide you with exceptional heart care, our providers are all part of one team.  This team includes your primary Cardiologist (physician) and Advanced Practice Providers or APPs (Physician Assistants and Nurse Practitioners) who all work together to provide you with the care you need, when you need it.  Your next appointment:   3-4 week(s)  Provider:   One of our Advanced Practice Providers (APPs): Morse Clause, PA-C  Lamarr Satterfield, NP Miriam Shams, NP  Olivia Pavy, PA-C Josefa Beauvais, NP  Leontine Salen, PA-C Orren Fabry, PA-C  Hao Meng, PA-C Ernest Dick, NP  Damien Braver, NP Jon Hails, PA-C  Waddell Donath, PA-C    Dayna Dunn, PA-C  Scott Weaver, PA-C Lum Louis, NP Katlyn West, NP Callie Goodrich, PA-C  Xika Zhao, NP Sheng Haley, PA-C    Kathleen Johnson, PA-C    We recommend signing up for the patient portal called MyChart.  Sign up information is provided on this After Visit Summary.  MyChart is used to connect with patients for Virtual Visits (Telemedicine).  Patients are able to view lab/test results, encounter notes, upcoming appointments, etc.  Non-urgent messages can be sent to your provider as well.   To learn more about what you can do with MyChart, go to [email or web address].   Other Instructions       Cardiac/Peripheral Catheterization   You are scheduled for a Cardiac Catheterization on Thursday, December 18 with Dr. [name].  1. Please arrive at the [facility] (Main Entrance A) at [facility]: [address] [place], [place] [address] at 9:00 AM (This time is 2 hour(s) before your procedure to ensure your preparation). Your procedure is scheduled to begin at 11 AM.  Free valet parking service is available. You will check in at ADMITTING. The support person will be asked to wait in the waiting room.  It is OK to have someone drop you off and come back when you are  ready to be discharged.        Special note: Every effort is made to have your procedure done on time. Please understand that emergencies sometimes delay scheduled procedures.  2. Diet: Nothing to eat after midnight.  3. Hydration:You need to be well hydrated before your procedure. On December 18, you may drink approved liquids (see below) until 2 hours before the procedure, with 16 oz of water  as your last intake.   List of approved liquids water , clear juice, clear tea, black coffee, fruit juices, non-citric and without pulp, carbonated beverages, Gatorade, Kool -Aid, plain Jello-O and plain ice popsicles.  4. Labs: You will need to have blood drawn on Monday, December 15 at [facility] (1st Floor),  [address], [place], [place] [address]. You do not need to be fasting.  5. Medication instructions in preparation for your procedure:   Contrast Allergy: No  On the morning of your procedure, take Aspirin  81 mg and any morning medicines NOT listed above.  You may use sips of water .  6. Plan to go home the same day, you will only stay overnight if medically necessary. 7. You MUST have a responsible adult to drive you home. 8. An adult MUST be with you the first 24 hours after you arrive home. 9. Bring a current list of your medications, and the last time and date medication taken. 10. Bring ID and current insurance cards. 11.Please wear clothes that are easy to get on and off and wear slip-on shoes.  Thank you for allowing us  to care for you!   -- [facility] Cardiovascular services     Signed, [name], MD  03/01/2024 5:55 AM     [facility]     [1]  Current Meds  Medication Sig   amLODipine  (NORVASC ) 5 MG tablet Take 5 mg by mouth at bedtime.   aspirin  EC 81 MG tablet Take 81 mg by mouth at bedtime. Swallow whole.   metoprolol  succinate (TOPROL -XL) 25 MG 24 hr tablet Take 1 tablet (25 mg total) by mouth  daily. Take with or immediately following a meal. (Patient taking differently: Take 25 mg by mouth daily with supper. Take with or immediately following a meal.)   nitroGLYCERIN  (NITROSTAT ) 0.4 MG SL tablet Place 1 tablet (0.4 mg total) under the tongue every 5 (five) minutes as needed for chest pain. The proper use and anticipated side effects of nitroglycerine has been carefully explained.  If a single episode of chest pain is not relieved by one tablet, the patient will try another within 5 minutes; and if this doesn't relieve the pain, the patient is instructed to call 911 for transportation to an emergency department.   rosuvastatin  (CRESTOR ) 40 MG tablet Take 40 mg by mouth at bedtime.

## 2024-03-01 ENCOUNTER — Encounter: Payer: Self-pay | Admitting: Cardiovascular Disease

## 2024-03-01 ENCOUNTER — Telehealth: Payer: Self-pay | Admitting: *Deleted

## 2024-03-01 NOTE — Telephone Encounter (Signed)
 Cardiac Catheterization scheduled at [facility] for: Thursday March 02, 2024 12 Noon (time change per cath lab schedule) Arrival time [facility] Main Entrance A at: 10 AM  Diet: -May have light meal until 6 AM. (6 hours before procedure time) Approved light meal consists of plain toast, fruit, light soups, crackers.  Hydration: -May drink clear liquids until 2 hours before the procedure.  Approved liquids: Water , clear tea, black coffee, fruit juices-non-citric and without pulp,Gatorade, plain Jello/popsicles.   -Please drink 16 oz of water  2 hours before procedure.  Medication instructions: -Usual morning medications can be taken including aspirin  81 mg.  Plan to go home the same day, you will only stay overnight if medically necessary.  You must have responsible adult to drive you home.  Someone must be with you the first 24 hours after you arrive home.  Reviewed procedure instructions with patient.

## 2024-03-02 ENCOUNTER — Encounter (HOSPITAL_COMMUNITY): Admission: RE | Payer: Self-pay | Attending: Cardiovascular Disease

## 2024-03-02 ENCOUNTER — Ambulatory Visit (HOSPITAL_COMMUNITY)
Admission: RE | Admit: 2024-03-02 | Discharge: 2024-03-03 | Disposition: A | Attending: Cardiovascular Disease | Admitting: Cardiovascular Disease

## 2024-03-02 ENCOUNTER — Encounter (HOSPITAL_COMMUNITY): Payer: Self-pay | Admitting: Cardiovascular Disease

## 2024-03-02 ENCOUNTER — Other Ambulatory Visit: Payer: Self-pay

## 2024-03-02 DIAGNOSIS — E669 Obesity, unspecified: Secondary | ICD-10-CM | POA: Insufficient documentation

## 2024-03-02 DIAGNOSIS — Z79899 Other long term (current) drug therapy: Secondary | ICD-10-CM | POA: Insufficient documentation

## 2024-03-02 DIAGNOSIS — I10 Essential (primary) hypertension: Secondary | ICD-10-CM | POA: Diagnosis not present

## 2024-03-02 DIAGNOSIS — Z7902 Long term (current) use of antithrombotics/antiplatelets: Secondary | ICD-10-CM | POA: Diagnosis not present

## 2024-03-02 DIAGNOSIS — I25110 Atherosclerotic heart disease of native coronary artery with unstable angina pectoris: Secondary | ICD-10-CM

## 2024-03-02 DIAGNOSIS — E785 Hyperlipidemia, unspecified: Secondary | ICD-10-CM | POA: Insufficient documentation

## 2024-03-02 DIAGNOSIS — E6609 Other obesity due to excess calories: Secondary | ICD-10-CM

## 2024-03-02 DIAGNOSIS — Z955 Presence of coronary angioplasty implant and graft: Secondary | ICD-10-CM

## 2024-03-02 DIAGNOSIS — Z7982 Long term (current) use of aspirin: Secondary | ICD-10-CM | POA: Insufficient documentation

## 2024-03-02 DIAGNOSIS — Z8249 Family history of ischemic heart disease and other diseases of the circulatory system: Secondary | ICD-10-CM | POA: Insufficient documentation

## 2024-03-02 DIAGNOSIS — I25118 Atherosclerotic heart disease of native coronary artery with other forms of angina pectoris: Secondary | ICD-10-CM | POA: Diagnosis present

## 2024-03-02 DIAGNOSIS — Z789 Other specified health status: Secondary | ICD-10-CM

## 2024-03-02 DIAGNOSIS — Z6833 Body mass index (BMI) 33.0-33.9, adult: Secondary | ICD-10-CM | POA: Diagnosis not present

## 2024-03-02 DIAGNOSIS — E7800 Pure hypercholesterolemia, unspecified: Secondary | ICD-10-CM | POA: Insufficient documentation

## 2024-03-02 HISTORY — PX: LEFT HEART CATH AND CORONARY ANGIOGRAPHY: CATH118249

## 2024-03-02 HISTORY — PX: CORONARY STENT INTERVENTION: CATH118234

## 2024-03-02 LAB — POCT ACTIVATED CLOTTING TIME
Activated Clotting Time: 240 s
Activated Clotting Time: 240 s

## 2024-03-02 SURGERY — LEFT HEART CATH AND CORONARY ANGIOGRAPHY
Anesthesia: LOCAL

## 2024-03-02 MED ORDER — ACETAMINOPHEN 325 MG PO TABS
650.0000 mg | ORAL_TABLET | ORAL | Status: DC | PRN
  Administered 2024-03-03: 650 mg via ORAL
  Filled 2024-03-02: qty 2

## 2024-03-02 MED ORDER — LIDOCAINE HCL (PF) 1 % IJ SOLN
INTRAMUSCULAR | Status: AC
  Filled 2024-03-02: qty 30

## 2024-03-02 MED ORDER — LIDOCAINE HCL (PF) 1 % IJ SOLN
INTRAMUSCULAR | Status: DC | PRN
  Administered 2024-03-02: 17:00:00 5 mL

## 2024-03-02 MED ORDER — IOHEXOL 350 MG/ML SOLN
INTRAVENOUS | Status: DC | PRN
  Administered 2024-03-02: 17:00:00 140 mL

## 2024-03-02 MED ORDER — SODIUM CHLORIDE 0.9% FLUSH
3.0000 mL | Freq: Two times a day (BID) | INTRAVENOUS | Status: DC
  Administered 2024-03-02 – 2024-03-03 (×2): 3 mL via INTRAVENOUS

## 2024-03-02 MED ORDER — CLOPIDOGREL BISULFATE 300 MG PO TABS
ORAL_TABLET | ORAL | Status: AC
  Filled 2024-03-02: qty 2

## 2024-03-02 MED ORDER — HEPARIN (PORCINE) IN NACL 2000-0.9 UNIT/L-% IV SOLN
INTRAVENOUS | Status: DC | PRN
  Administered 2024-03-02: 17:00:00 1000 mL

## 2024-03-02 MED ORDER — SODIUM CHLORIDE 0.9 % IV SOLN: 250.0000 mL | INTRAVENOUS | Status: DC | PRN

## 2024-03-02 MED ORDER — ASPIRIN 81 MG PO TBEC
81.0000 mg | DELAYED_RELEASE_TABLET | Freq: Every day | ORAL | Status: DC
  Administered 2024-03-03: 81 mg via ORAL
  Filled 2024-03-02: qty 1

## 2024-03-02 MED ORDER — ASPIRIN 81 MG PO CHEW: 81.0000 mg | CHEWABLE_TABLET | ORAL | Status: DC

## 2024-03-02 MED ORDER — SODIUM CHLORIDE 0.9% FLUSH: 3.0000 mL | Freq: Two times a day (BID) | INTRAVENOUS | Status: DC

## 2024-03-02 MED ORDER — CLOPIDOGREL BISULFATE 300 MG PO TABS
ORAL_TABLET | ORAL | Status: DC | PRN
  Administered 2024-03-02: 17:00:00 600 mg via ORAL

## 2024-03-02 MED ORDER — MIDAZOLAM HCL 2 MG/2ML IJ SOLN
INTRAMUSCULAR | Status: AC
  Filled 2024-03-02: qty 2

## 2024-03-02 MED ORDER — HEPARIN SODIUM (PORCINE) 1000 UNIT/ML IJ SOLN
INTRAMUSCULAR | Status: AC
  Filled 2024-03-02: qty 10

## 2024-03-02 MED ORDER — OXYCODONE HCL 5 MG PO TABS: 5.0000 mg | ORAL_TABLET | ORAL | Status: DC | PRN

## 2024-03-02 MED ORDER — SODIUM CHLORIDE 0.9% FLUSH: 3.0000 mL | INTRAVENOUS | Status: DC | PRN

## 2024-03-02 MED ORDER — LABETALOL HCL 5 MG/ML IV SOLN: 10.0000 mg | INTRAVENOUS | Status: AC | PRN

## 2024-03-02 MED ORDER — FENTANYL CITRATE (PF) 100 MCG/2ML IJ SOLN
INTRAMUSCULAR | Status: AC
  Filled 2024-03-02: qty 2

## 2024-03-02 MED ORDER — METOPROLOL SUCCINATE ER 25 MG PO TB24
25.0000 mg | ORAL_TABLET | Freq: Every day | ORAL | Status: DC
  Administered 2024-03-03: 25 mg via ORAL
  Filled 2024-03-02: qty 1

## 2024-03-02 MED ORDER — FREE WATER
500.0000 mL | Freq: Once | Status: AC
  Administered 2024-03-02: 21:00:00 500 mL via ORAL

## 2024-03-02 MED ORDER — MORPHINE SULFATE (PF) 2 MG/ML IV SOLN: 2.0000 mg | INTRAVENOUS | Status: DC | PRN

## 2024-03-02 MED ORDER — VERAPAMIL HCL 2.5 MG/ML IV SOLN
INTRAVENOUS | Status: DC | PRN
  Administered 2024-03-02: 17:00:00 10 mL via INTRA_ARTERIAL

## 2024-03-02 MED ORDER — ONDANSETRON HCL 4 MG/2ML IJ SOLN: 4.0000 mg | Freq: Four times a day (QID) | INTRAMUSCULAR | Status: DC | PRN

## 2024-03-02 MED ORDER — AMLODIPINE BESYLATE 5 MG PO TABS
5.0000 mg | ORAL_TABLET | Freq: Every day | ORAL | Status: DC
  Administered 2024-03-02: 21:00:00 5 mg via ORAL
  Filled 2024-03-02: qty 1

## 2024-03-02 MED ORDER — VERAPAMIL HCL 2.5 MG/ML IV SOLN
INTRAVENOUS | Status: AC
  Filled 2024-03-02: qty 2

## 2024-03-02 MED ORDER — FENTANYL CITRATE (PF) 100 MCG/2ML IJ SOLN
INTRAMUSCULAR | Status: DC | PRN
  Administered 2024-03-02 (×2): 25 ug via INTRAVENOUS

## 2024-03-02 MED ORDER — HEPARIN SODIUM (PORCINE) 1000 UNIT/ML IJ SOLN
INTRAMUSCULAR | Status: DC | PRN
  Administered 2024-03-02: 17:00:00 6000 [IU] via INTRAVENOUS
  Administered 2024-03-02: 17:00:00 5000 [IU] via INTRAVENOUS
  Administered 2024-03-02: 17:00:00 4000 [IU] via INTRAVENOUS

## 2024-03-02 MED ORDER — ROSUVASTATIN CALCIUM 20 MG PO TABS
40.0000 mg | ORAL_TABLET | Freq: Every day | ORAL | Status: DC
  Administered 2024-03-02: 21:00:00 40 mg via ORAL
  Filled 2024-03-02: qty 2

## 2024-03-02 MED ORDER — CLOPIDOGREL BISULFATE 75 MG PO TABS
75.0000 mg | ORAL_TABLET | Freq: Every day | ORAL | Status: DC
  Administered 2024-03-03: 75 mg via ORAL
  Filled 2024-03-02: qty 1

## 2024-03-02 MED ORDER — MIDAZOLAM HCL (PF) 2 MG/2ML IJ SOLN
INTRAMUSCULAR | Status: DC | PRN
  Administered 2024-03-02 (×2): 2 mg via INTRAVENOUS

## 2024-03-02 MED ORDER — HYDRALAZINE HCL 20 MG/ML IJ SOLN: 10.0000 mg | INTRAMUSCULAR | Status: AC | PRN

## 2024-03-02 MED ADMIN — Nitroglycerin IV Soln 100 MCG/ML in D5W: 200 ug | INTRACORONARY | @ 17:00:00 | NDC 61141483410

## 2024-03-02 SURGICAL SUPPLY — 13 items
BALLOON EMERGE MR 2.0X12 (BALLOONS) IMPLANT
BALLOON ~~LOC~~ EMERGE MR 3.25X15 (BALLOONS) IMPLANT
CATH 5FR JL3.5 JR4 ANG PIG MP (CATHETERS) IMPLANT
CATH LAUNCHER 5F EBU3.5 (CATHETERS) IMPLANT
CATH LAUNCHER 6FR EBU3.5 (CATHETERS) IMPLANT
DEVICE RAD COMP TR BAND LRG (VASCULAR PRODUCTS) IMPLANT
GLIDESHEATH SLEND SS 6F .021 (SHEATH) IMPLANT
GUIDEWIRE INQWIRE 1.5J.035X260 (WIRE) IMPLANT
KIT ENCORE 26 ADVANTAGE (KITS) IMPLANT
PACK CARDIAC CATHETERIZATION (CUSTOM PROCEDURE TRAY) ×1 IMPLANT
SET ATX-X65L (MISCELLANEOUS) IMPLANT
STENT SYNERGY XD 3.0X28 (Permanent Stent) IMPLANT
WIRE RUNTHROUGH .014X180CM (WIRE) IMPLANT

## 2024-03-02 NOTE — Interval H&P Note (Signed)
 History and Physical Interval Note:  03/02/2024 4:31 PM  Tyler Mcgrath  has presented today for surgery, with the diagnosis of unstable angina.  The various methods of treatment have been discussed with the patient and family. After consideration of risks, benefits and other options for treatment, the patient has consented to  Procedures: LEFT HEART CATH AND CORONARY ANGIOGRAPHY (N/A) as a surgical intervention.  The patient's history has been reviewed, patient examined, no change in status, stable for surgery.  I have reviewed the patient's chart and labs.  Questions were answered to the patient's satisfaction.     [name]

## 2024-03-03 ENCOUNTER — Other Ambulatory Visit (HOSPITAL_COMMUNITY): Payer: Self-pay

## 2024-03-03 DIAGNOSIS — E7800 Pure hypercholesterolemia, unspecified: Secondary | ICD-10-CM | POA: Diagnosis not present

## 2024-03-03 DIAGNOSIS — Z6833 Body mass index (BMI) 33.0-33.9, adult: Secondary | ICD-10-CM | POA: Diagnosis not present

## 2024-03-03 DIAGNOSIS — I25110 Atherosclerotic heart disease of native coronary artery with unstable angina pectoris: Secondary | ICD-10-CM | POA: Diagnosis not present

## 2024-03-03 DIAGNOSIS — E785 Hyperlipidemia, unspecified: Secondary | ICD-10-CM | POA: Diagnosis not present

## 2024-03-03 DIAGNOSIS — Z789 Other specified health status: Secondary | ICD-10-CM

## 2024-03-03 DIAGNOSIS — E6609 Other obesity due to excess calories: Secondary | ICD-10-CM | POA: Diagnosis not present

## 2024-03-03 DIAGNOSIS — I25118 Atherosclerotic heart disease of native coronary artery with other forms of angina pectoris: Secondary | ICD-10-CM | POA: Diagnosis not present

## 2024-03-03 DIAGNOSIS — Z8249 Family history of ischemic heart disease and other diseases of the circulatory system: Secondary | ICD-10-CM | POA: Diagnosis not present

## 2024-03-03 DIAGNOSIS — E669 Obesity, unspecified: Secondary | ICD-10-CM | POA: Diagnosis not present

## 2024-03-03 DIAGNOSIS — E66811 Obesity, class 1: Secondary | ICD-10-CM

## 2024-03-03 DIAGNOSIS — I10 Essential (primary) hypertension: Secondary | ICD-10-CM

## 2024-03-03 DIAGNOSIS — Z7982 Long term (current) use of aspirin: Secondary | ICD-10-CM | POA: Diagnosis not present

## 2024-03-03 DIAGNOSIS — Z79899 Other long term (current) drug therapy: Secondary | ICD-10-CM | POA: Diagnosis not present

## 2024-03-03 DIAGNOSIS — Z7902 Long term (current) use of antithrombotics/antiplatelets: Secondary | ICD-10-CM | POA: Diagnosis not present

## 2024-03-03 LAB — CBC
HCT: 43.9 % (ref 39.0–52.0)
Hemoglobin: 15.4 g/dL (ref 13.0–17.0)
MCH: 28.9 pg (ref 26.0–34.0)
MCHC: 35.1 g/dL (ref 30.0–36.0)
MCV: 82.4 fL (ref 80.0–100.0)
Platelets: 309 K/uL (ref 150–400)
RBC: 5.33 MIL/uL (ref 4.22–5.81)
RDW: 12.5 % (ref 11.5–15.5)
WBC: 15.9 K/uL — ABNORMAL HIGH (ref 4.0–10.5)
nRBC: 0 % (ref 0.0–0.2)

## 2024-03-03 LAB — BASIC METABOLIC PANEL WITH GFR
Anion gap: 12 (ref 5–15)
BUN: 12 mg/dL (ref 6–20)
CO2: 25 mmol/L (ref 22–32)
Calcium: 9.4 mg/dL (ref 8.9–10.3)
Chloride: 101 mmol/L (ref 98–111)
Creatinine, Ser: 0.88 mg/dL (ref 0.61–1.24)
GFR, Estimated: >60 mL/min
Glucose, Bld: 121 mg/dL — ABNORMAL HIGH (ref 70–99)
Potassium: 4.1 mmol/L (ref 3.5–5.1)
Sodium: 138 mmol/L (ref 135–145)

## 2024-03-03 MED ORDER — AMLODIPINE BESYLATE 10 MG PO TABS
10.0000 mg | ORAL_TABLET | Freq: Every day | ORAL | 3 refills | Status: AC
  Filled 2024-03-03: qty 30, 30d supply, fill #0

## 2024-03-03 MED ORDER — CLOPIDOGREL BISULFATE 75 MG PO TABS
75.0000 mg | ORAL_TABLET | Freq: Every day | ORAL | 1 refills | Status: AC
  Filled 2024-03-03: qty 30, 30d supply, fill #0

## 2024-03-03 MED ORDER — ATORVASTATIN CALCIUM 40 MG PO TABS
40.0000 mg | ORAL_TABLET | Freq: Every day | ORAL | 11 refills | Status: AC
  Filled 2024-03-03: qty 30, 30d supply, fill #0

## 2024-03-03 MED ORDER — AMLODIPINE BESYLATE 10 MG PO TABS: 10.0000 mg | ORAL_TABLET | Freq: Every day | ORAL | Status: DC

## 2024-03-03 MED ORDER — EZETIMIBE 10 MG PO TABS
10.0000 mg | ORAL_TABLET | Freq: Every day | ORAL | 11 refills | Status: AC
  Filled 2024-03-03: qty 30, 30d supply, fill #0

## 2024-03-03 NOTE — Progress Notes (Signed)
 TR band on right radial removed after gradually air deflated per protocol. Dry gauze dressing applied. Pt has no active bleeding or hematoma. He is alert and fully oriented x 4, stable hemodynamically, afebrile, NSR on the monitor, on room air, normal respiratory effort. No acute distress noted overnight. Pt is able to rest and sleep well with no major complaints. Plan of care is reviewed. Pt has been progressing. We will continue to monitor.    Wendi Dash, RN

## 2024-03-03 NOTE — Discharge Summary (Addendum)
 " Discharge Summary   Patient ID: Tyler Mcgrath MRN: [id number]; DOB: 01-Jun-1975  Admit date: 03/02/2024 Discharge date: 03/03/2024  PCP:  Allen, Chad, NP   [facility] Providers Cardiologist:  [name], MD   {  Discharge Diagnoses  Principal Problem:   Coronary artery disease involving native coronary artery of native heart with unstable angina pectoris [facility]) Active Problems:   Hypertension   Hyperlipidemia   Diagnostic Studies/Procedures   Left Cardiac Catheterization 03/02/2024:   Ost LAD to Prox LAD lesion is 95% stenosed.   1st Mrg lesion is 40% stenosed.   2nd Mrg lesion is 40% stenosed.   1st Diag lesion is 50% stenosed.   2nd Diag lesion is 75% stenosed.   Mid LAD lesion is 50% stenosed.   Mid RCA lesion is 40% stenosed.   Dist RCA lesion is 50% stenosed.   A drug-eluting stent was successfully placed using a STENT SYNERGY XD 3.0X28.   Post intervention, there is a 0% residual stenosis.   The left ventricular systolic function is normal.   LV end diastolic pressure is normal.   The left ventricular ejection fraction is 55-65% by visual estimate.   Recommend uninterrupted dual antiplatelet therapy with Aspirin  81mg  daily and Clopidogrel  75mg  daily for a minimum of 6 months (stable ischemic heart disease-Class I recommendation).   1.  Severe proximal LAD stenosis, treated successfully with PCI using a 3.0 x 28 mm Synergy DES 2.  Diffuse nonobstructive coronary artery disease involving the diagonal branches of the LAD, mid to distal LAD, circumflex, and RCA 3.  Normal LV function and normal LVEDP   Recommendations: Overnight observation due to the timing of procedure late in the day.  DAPT with aspirin  and clopidogrel  times minimum 6 months.  Aggressive risk reduction measures.  Medical therapy for residual CAD.  Diagnostic Dominance: Right  Intervention     _____________   History of Present Illness   Tyler Mcgrath is a 48 y.o. male with  history of an elevated coronary calcium  of 427 (98th percentile) in 01/2024, hypertension, hyperlipidemia, and sleep apnea.  He was recently referred to Dr. Fell on 02/28/2024 at which time he reported once exertional chest pressure relieved by rest that has been getting progressively worse over the last 2 weeks.  Given multiple CV risk factors including a coronary calcium  score of 427 in 01/2024, outpatient cardiac catheterization was recommended and arranged due to unstable angina.   Hospital Course   Consultants: None   Patient presented to [facility] on 03/02/2024 for planned cardiac catheterization. LHC showed 95% stenosis of ostial to proximal LAD and otherwise diffuse nonobstructive disease involving the diagonal branches, mid to distal LAD, LCx, and RCA.  He underwent successful PCI with DES to the LAD lesion.  He tolerated the procedure well but was kept overnight for observation due to the timing of procedure late in the day.  He is feeling great this morning and states this is the best he has felt in a long time.  Plan is for DAPT with Aspirin  81mg  daily and Plavix  75mg  daily for at least 6 months. He was recently started on Crestor  40mg  daily a couple of weeks ago reports myalgias with this.  Will switch to Lipitor 40mg  daily and Zetia  10mg  daily. Will need repeat lipid panel and LFTs in 6-8 weeks. Will go ahead and refer patient to Lipid Clinic as he is interested in Repatha.  Will increase home Amlodipine  to 10mg  daily for additional BP control.  Continue Toprol -XL 25mg  daily. Right radial cath site was re-evaluated prior to discharge and found to be stable without any complications. Instructions/precautions regarding cath site care were given prior to discharge. Will have Cardiac Rehab see prior to discharge.  Patient was seen by Dr. Ladona today and determined stable for discharge home. Outpatient follow-up arranged. Medications are listed below.   Did the patient have an acute  coronary syndrome (MI, NSTEMI, STEMI, etc) this admission?:  No                               Did the patient have a percutaneous coronary intervention (stent / angioplasty)?:  Yes.     Cath/PCI Registry Performance & Quality Measures: Aspirin  prescribed? - Yes ADP Receptor Inhibitor (Plavix /Clopidogrel , Brilinta/Ticagrelor or Effient/Prasugrel) prescribed (includes medically managed patients)? - Yes High Intensity Statin (Lipitor 40-80mg  or Crestor  20-40mg ) prescribed? - Yes For EF <40%, was ACEI/ARB prescribed? - Not Applicable (EF >/= 40% on LV gram) For EF <40%, Aldosterone Antagonist (Spironolactone or Eplerenone) prescribed? - Not Applicable (EF >/= 40% on LV gram) Cardiac Rehab Phase II ordered? - Yes  _____________  Discharge Vitals Blood pressure (!) 144/83, pulse 78, temperature 98.4 F (36.9 C), temperature source Oral, resp. rate 15, height 5' 9 (1.753 m), weight 101.6 kg, SpO2 98%.  Filed Weights   03/02/24 1025  Weight: 101.6 kg   General: 48 y.o. male resting comfortably in no acute distress. HEENT: Normocephalic and atraumatic. Sclera clear. EOMs intact. Neck: Supple. No JVD. Heart: RRR. Distinct S1 and S2. No murmurs, gallops, or rubs. Right radial cath site soft with no signs of hematoma. Lungs: No increased work of breathing. Clear to ausculation bilaterally. No wheezes, rhonchi, or rales.  Extremities: No lower extremity edema. Skin: Warm and dry. Neuro: No focal deficits. Psych: Normal affect. Responds appropriately.   Labs & Radiologic Studies  CBC Recent Labs    03/03/24 0536  WBC 15.9*  HGB 15.4  HCT 43.9  MCV 82.4  PLT 309   Basic Metabolic Panel Recent Labs    87/80/74 0536  NA 138  K 4.1  CL 101  CO2 25  GLUCOSE 121*  BUN 12  CREATININE 0.88  CALCIUM  9.4   Liver Function Tests No results for input(s): AST, ALT, ALKPHOS, BILITOT, PROT, ALBUMIN in the last 72 hours. No results for input(s): LIPASE, AMYLASE in the last  72 hours. High Sensitivity Troponin:   No results for input(s): TROPONINIHS in the last 720 hours.  No results for input(s): TRNPT in the last 720 hours.  BNP Invalid input(s): POCBNP No results for input(s): PROBNP in the last 72 hours.  No results for input(s): BNP in the last 72 hours.  D-Dimer No results for input(s): DDIMER in the last 72 hours. Hemoglobin A1C No results for input(s): HGBA1C in the last 72 hours. Fasting Lipid Panel No results for input(s): CHOL, HDL, LDLCALC, TRIG, CHOLHDL, LDLDIRECT in the last 72 hours. No results found for: LIPOA  Thyroid Function Tests No results for input(s): TSH, T4TOTAL, T3FREE, THYROIDAB in the last 72 hours.  Invalid input(s): FREET3 _____________  CARDIAC CATHETERIZATION Result Date: 03/02/2024   Ost LAD to Prox LAD lesion is 95% stenosed.   1st Mrg lesion is 40% stenosed.   2nd Mrg lesion is 40% stenosed.   1st Diag lesion is 50% stenosed.   2nd Diag lesion is 75% stenosed.   Mid LAD lesion is 50% stenosed.  Mid RCA lesion is 40% stenosed.   Dist RCA lesion is 50% stenosed.   A drug-eluting stent was successfully placed using a STENT SYNERGY XD 3.0X28.   Post intervention, there is a 0% residual stenosis.   The left ventricular systolic function is normal.   LV end diastolic pressure is normal.   The left ventricular ejection fraction is 55-65% by visual estimate.   Recommend uninterrupted dual antiplatelet therapy with Aspirin  81mg  daily and Clopidogrel  75mg  daily for a minimum of 6 months (stable ischemic heart disease-Class I recommendation). 1.  Severe proximal LAD stenosis, treated successfully with PCI using a 3.0 x 28 mm Synergy DES 2.  Diffuse nonobstructive coronary artery disease involving the diagonal branches of the LAD, mid to distal LAD, circumflex, and RCA 3.  Normal LV function and normal LVEDP Recommendations: Overnight observation due to the timing of procedure late in the day.  DAPT with  aspirin  and clopidogrel  times minimum 6 months.  Aggressive risk reduction measures.  Medical therapy for residual CAD.   CT CARDIAC SCORING (SELF PAY ONLY) Addendum Date: 02/11/2024 ADDENDUM REPORT: 02/11/2024 03:17 EXAM: OVER-READ INTERPRETATION  CT CHEST The following report is an over-read performed by radiologist Dr. Oneil Devonshire of [facility] on 02/11/2024. This over-read does not include interpretation of cardiac or coronary anatomy or pathology. The coronary calcium  score interpretation by the cardiologist is attached. COMPARISON:  None. FINDINGS: Cardiovascular: There are no significant extracardiac vascular findings. Mediastinum/Nodes: There are no enlarged lymph nodes within the visualized mediastinum. Lungs/Pleura: There is no pleural effusion. The visualized lungs appear clear. Upper abdomen: No significant findings in the visualized upper abdomen. Musculoskeletal/Chest wall: No chest wall mass or suspicious osseous findings within the visualized chest. IMPRESSION: No significant extracardiac findings within the visualized chest. Electronically Signed   By: Oneil Devonshire M.D.   On: 02/11/2024 03:17   Result Date: 02/11/2024 : CLINICAL DATA:  Cardiovascular Disease Risk stratification EXAM: Coronary Calcium  Score TECHNIQUE: A gated, non-contrast computed tomography scan of the heart was performed using 3mm slice thickness. Axial images were analyzed on a dedicated workstation. Calcium  scoring of the coronary arteries was performed using the Agatston method. FINDINGS: Coronary Calcium  Score: Left main: 0 Left anterior descending artery: 291 Left circumflex artery: 22 Right coronary artery: 115 Total: 427 Pericardium: Normal. Ascending Aorta: Normal caliber. Pulmonary artery: Normal caliber Non-cardiac: See separate report from [facility]. IMPRESSION: Coronary calcium  score of 427. This was 39 percentile for age-, race-, and sex-matched controls. RECOMMENDATIONS: Coronary  artery calcium  (CAC) score is a strong predictor of incident coronary heart disease (CHD) and provides predictive information beyond traditional risk factors. CAC scoring is reasonable to use in the decision to withhold, postpone, or initiate statin therapy in intermediate-risk or selected borderline-risk asymptomatic adults (age 80-75 years and LDL-C >=70 to <190 mg/dL) who do not have diabetes or established atherosclerotic cardiovascular disease (ASCVD).* In intermediate-risk (10-year ASCVD risk >=7.5% to <20%) adults or selected borderline-risk (10-year ASCVD risk >=5% to <7.5%) adults in whom a CAC score is measured for the purpose of making a treatment decision the following recommendations have been made: If CAC=0, it is reasonable to withhold statin therapy and reassess in 5 to 10 years, as long as higher risk conditions are absent (diabetes mellitus, family history of premature CHD in first degree relatives (males <55 years; females <65 years), cigarette smoking, or LDL >=190 mg/dL). If CAC is 1 to 99, it is reasonable to initiate statin therapy for patients >=55 years of  age. If CAC is >=100 or >=75th percentile, it is reasonable to initiate statin therapy at any age. Cardiology referral should be considered for patients with CAC scores >=400 or >=75th percentile. *2018 AHA/ACC/AACVPR/AAPA/ABC/ACPM/ADA/AGS/APhA/ASPC/NLA/PCNA Guideline on the Management of Blood Cholesterol: A Report of the American College of Cardiology/American Heart Association Task Force on Clinical Practice Guidelines. J Am Coll Cardiol. 2019;73(24):3168-3209. Electronically Signed: By: Lamar Fitch M.D. On: 02/09/2024 17:46    Disposition Patient is being discharged home today in good condition.  Follow-up Plans & Appointments  Follow-up Information     Meng, Hao, [place] Follow up.   Specialties: Cardiology, Radiology Why: Follow-up with Cardiology scheduled for 04/07/2023 at 9:40am. Please arrive 20 minutes early for  check-in. If this date/ time does not work for you, please call our office to reschedule. Contact information: [address] [place] [address] [phone]                Discharge Instructions     AMB Referral to Advanced Lipid Disorders Clinic   Complete by: As directed    Reason for referral: PCSK9 inhibitor education and prior authorization approvals   Referred to: PharmD   Internal Lipid Clinic Referral Scheduling  Internal lipid clinic referrals are providers within [facility], who wish to refer established patients for routine management (help in starting PCSK9 inhibitor therapy) or advanced therapies.  Internal MD referral criteria:              1. All patients with LDL>190 mg/dL  2. All patients with Triglycerides >500 mg/dL  3. Patients with suspected or confirmed heterozygous familial hyperlipidemia (HeFH) or homozygous familial hyperlipidemia (HoFH)  4. Patients with family history of suspicious for genetic dyslipidemia desiring genetic testing  5. Patients refractory to standard guideline based therapy  6. Patients with statin intolerance (failed 2 statins, one of which must be a high potency statin)  7. Patients who the provider desires to be seen by MD   Internal PharmD referral criteria:   1. Follow-up patients for medication management  2. Follow-up for compliance monitoring  3. Patients for drug education  4. Patients with statin intolerance  5. PCSK9 inhibitor education and prior authorization approvals  6. Patients with triglycerides <500 mg/dL  External Lipid Clinic Referral  External lipid clinic referrals are for providers outside of [facility], considered new clinic patients - automatically routed to MD schedule   AMB Referral to [facility] Pharm-D   Complete by: As directed    Reason For Referral: Lipids   Amb Referral to Cardiac Rehabilitation   Complete by: As directed    [place]   Diagnosis: Coronary Stents   After initial  evaluation and assessments completed: Virtual Based Care may be provided alone or in conjunction with Phase 2 Cardiac Rehab based on patient barriers.: Yes   Intensive Cardiac Rehabilitation (ICR) [facility] location only OR Traditional Cardiac Rehabilitation (TCR) *If criteria for ICR are not met will enroll in TCR [facility] only): Yes   Increase activity slowly   Complete by: As directed        Discharge Medications Allergies as of 03/03/2024   No Known Allergies      Medication List     STOP taking these medications    rosuvastatin  40 MG tablet Commonly known as: CRESTOR        TAKE these medications    amLODipine  10 MG tablet Commonly known as: NORVASC  Take 1 tablet (10 mg total) by mouth at bedtime. What changed:  medication strength how  much to take   aspirin  EC 81 MG tablet Take 81 mg by mouth at bedtime. Swallow whole.   atorvastatin  40 MG tablet Commonly known as: Lipitor Take 1 tablet (40 mg total) by mouth daily.   clopidogrel  75 MG tablet Commonly known as: PLAVIX  Take 1 tablet (75 mg total) by mouth daily with breakfast. Start taking on: March 04, 2024   ezetimibe  10 MG tablet Commonly known as: Zetia  Take 1 tablet (10 mg total) by mouth daily.   metoprolol  succinate 25 MG 24 hr tablet Commonly known as: TOPROL -XL Take 1 tablet (25 mg total) by mouth daily. Take with or immediately following a meal. What changed: when to take this   nitroGLYCERIN  0.4 MG SL tablet Commonly known as: NITROSTAT  Place 1 tablet (0.4 mg total) under the tongue every 5 (five) minutes as needed for chest pain. The proper use and anticipated side effects of nitroglycerine has been carefully explained.  If a single episode of chest pain is not relieved by one tablet, the patient will try another within 5 minutes; and if this doesn't relieve the pain, the patient is instructed to call 911 for transportation to an emergency department.         Outstanding Labs/Studies  Repeat  lipid panel and LFTs in 6-8 weeks.  Duration of Discharge Encounter: APP Time: 25 minutes   Signed, Ival Pacer E Judye Lorino, PA-C 03/03/2024, 12:14 PM     "

## 2024-03-03 NOTE — Progress Notes (Signed)
AVS given and explained to patient and wife.

## 2024-03-03 NOTE — Discharge Instructions (Addendum)
 Medication Changes: - START Plavix  75mg  daily. Take this in addition to the Aspirin  81mg  daily. These medications are very important in helping keep the new stent in your heart open. - STOP Rosuvastatin  (Crestor ) and START Atorvastatin (Lipitor) 40mg  daily and Zetia 10mg  daily instead. We will also go ahead and refer your to our Lipid Clinic for consideration of Repatha. - INCREASE Amlodipine  to 10mg  daily.  Post Cardiac Catheterization: NO HEAVY LIFTING OR SEXUAL ACTIVITY X 7 DAYS. NO DRIVING X 3-5 DAYS. NO SOAKING BATHS, HOT TUBS, POOLS, ETC., X 7 DAYS.  Radial Site Care: Refer to this sheet in the next few weeks. These instructions provide you with information on caring for yourself after your procedure. Your caregiver may also give you more specific instructions. Your treatment has been planned according to current medical practices, but problems sometimes occur. Call your caregiver if you have any problems or questions after your procedure. HOME CARE INSTRUCTIONS You may shower the day after the procedure. Remove the bandage (dressing) and gently wash the site with plain soap and water . Gently pat the site dry.  Do not apply powder or lotion to the site.  Do not submerge the affected site in water  for 3 to 5 days.  Inspect the site at least twice daily.  Do not flex or bend the affected arm for 24 hours.  No lifting over 5 pounds (2.3 kg) for 5 days after your procedure.  Do not drive home if you are discharged the same day of the procedure. Have someone else drive you.  What to expect: Any bruising will usually fade within 1 to 2 weeks.  Blood that collects in the tissue (hematoma) may be painful to the touch. It should usually decrease in size and tenderness within 1 to 2 weeks.  SEEK IMMEDIATE MEDICAL CARE IF: You have unusual pain at the radial site.  You have redness, warmth, swelling, or pain at the radial site.  You have drainage (other than a small amount of blood on the  dressing).  You have chills.  You have a fever or persistent symptoms for more than 72 hours.  You have a fever and your symptoms suddenly get worse.  Your arm becomes pale, cool, tingly, or numb.  You have heavy bleeding from the site. Hold pressure on the site.

## 2024-03-03 NOTE — Progress Notes (Signed)
 CARDIAC REHAB PHASE I    Patient seen in room with wife at bedside. Reports no sob or angina at rest and with independent ambulation efforts. Patient seen independently ambulating in room and up to chair. Post stent education including site care, restrictions, risk factors, exercise guidelines, NTG use, antiplatelet therapy importance, heart healthy diet, and CRP2 reviewed. All questions and concerns addressed. Will refer to [facility] for CRP2. Plan for home later today.    [id number] Tyler JAYSON [place], RN BSN 03/03/2024 11:17 AM

## 2024-03-04 LAB — LIPOPROTEIN A (LPA): Lipoprotein (a): 157.4 nmol/L — ABNORMAL HIGH

## 2024-03-06 ENCOUNTER — Telehealth (HOSPITAL_COMMUNITY): Payer: Self-pay

## 2024-03-06 NOTE — Telephone Encounter (Signed)
 Per Phase I, Cardiac Rehab referral faxed to [facility]

## 2024-03-10 ENCOUNTER — Ambulatory Visit (HOSPITAL_COMMUNITY): Admit: 2024-03-10 | Admitting: Cardiovascular Disease

## 2024-03-10 ENCOUNTER — Encounter (HOSPITAL_COMMUNITY): Payer: Self-pay

## 2024-03-21 NOTE — Progress Notes (Unsigned)
 Patient ID: Tyler Mcgrath                 DOB: 19-Sep-1975                    MRN: [id number]      HPI: Tyler Mcgrath is a 49 y.o. male patient referred to lipid clinic by Dr Ladona. PMH is significant for HLD, CAD s/p PCI with DES to LAD lesion on 02/2024, CAC score 427 (98th percentile), HTN and obesity.  Patient underwent cardiac catheterization (Dec 2025) revealing 95% ostial-proximal LAD stenosis and diffuse non-obstructive disease in diagonal branches, mid-distal LAD, LCx, and RCA. Successful PCI with DES to LAD performed. DAPT for 6 months with aspirin  81 mg and clopidogrel  75 mg daily. Recently started on rosuvastatin  40 mg (early December) but developed myalgias; switched to atorvastatin  40 mg plus ezetimibe  10 mg daily. Tentative plan to repeat lipid panel and LFTs in 6-8 weeks and patient referred to lipid clinic for possible Repatha initiation.    Patient presents in good spirits. Currently on atorvastatin  40 mg daily and ezetimibe  10 mg daily for HLD management. Reports myalgias improved after switching to atorvastatin  but remain bothersome; taking medications in the afternoon helps. Interested in replacing statin with an alternative, if affordable. Prior to stent placement, diet was poor; now following a heart-healthy diet. Has limited exercise per cardiac rehab instructions but is eager to start rehab soon.  We reviewed options for lowering LDL cholesterol, including PCSK-9 inhibitors, bempedoic acid and inclisiran.  Discussed mechanisms of action, dosing, side effects and potential decreases in LDL cholesterol.  Also reviewed cost information and potential options for patient assistance.   Current Medications: atorvastatin  40 mg daily, ezetimibe  10 mg daily Intolerances: rosuvastatin  (myalgias) Risk Factors: HLD, CAD s/p PCI with DES to LAD lesion on 02/2024, CAC score 427 (98th percentile), HTN, family hx of CAD (mother had multivessel CABG in her 5's, sister had a heart attack at  64) LDL goal: < 70 Lipid panel (08/2023): Chol 200 Trig 254, HDL 32,  LDL 123 (prior to statin) Lpa (02/2024): 157.4 Liver enzymes (08/2023): ALT 19, AST 17, Alk phos 95 (prior to statin)  Diet:  Breakfast typically includes Greek yogurt with banana, boiled egg with yogurt, or spinach and egg, paired with unsweetened tea. Lunch and dinner consist of turkey breast with green beans or cauliflower flatbread topped with mushrooms and peppers, also with unsweetened tea. Snacks include about 10 shrimp. Beverages: unsweetened tea and approximately four 16.9 oz bottles of water  daily.  Exercise:  Has done little exercise since cardiac rehab; assessment scheduled next Tuesday. Has been doing push-ups in the meantime.  Family History:  He has a family history of CAD. His mother had multivessel CABG in her 13's, sister had a heart attack at 60. Grandfather (maternal) heart attack and died 71s. Grandmother (maternal) heart attack in 72s  Social History:  Alcohol: none Smoking: none   Labs:  Lipid Panel  No results found for: CHOL, TRIG, HDL, CHOLHDL, VLDL, LDLCALC, LDLDIRECT, LABVLDL  Past Medical History:  Diagnosis Date   Kidney stones    Sleep apnea     Medications Ordered Prior to Encounter[1]  Allergies[2]  Assessment/Plan:  1. Hyperlipidemia -  Problem  Hypercholesterolemia   Hypercholesterolemia Assessment:  LDL goal: < 70 mg/dl; last LDLc 876 mg/dl (05/7972) prior to statin and ezetimibe  initiation Lpa (02/2024): 157.4 Intolerance to rosuvastatin  (myalgias); reports muscle pain still bothersome after switching to atorvastatin ; reports no issues related  to ezetimibe    Discussed next potential options (PCSK-9 inhibitors, bempedoic acid and inclisiran); cost, dosing efficacy, side effects  Patient interested in proceeding with PCSK9i therapy and would like to replace statin due to myalgias Encouraged patient to continue heart healthy diet and attend cardiac rehab  as scheduled  Plan: Continue taking atorvastatin  40 mg daily and ezetimibe  10 mg daily Will apply for PA for PCSK9i; will inform patient upon approval  Tentative plan to replace statin with PCSK9i if approved  Instructed patient to obtain lipid panel and hepatic panel in 1-2 weeks and bring results to f/u appointment with provider on 04/06/24 - he will obtain at [facility] primary care Lipid panel and Lpa in 3 months after starting PCSK9i; patient to receive labs at [facility] primary care   Thank you,  Swayzie Choate E. Margree Gimbel, Pharm.D, CPP [place] [facility]. [facility] [address], [place], [place] [address] Phone: [phone]; Fax: [phone]        [1]  Current Outpatient Medications on File Prior to Visit  Medication Sig Dispense Refill   amLODipine  (NORVASC ) 10 MG tablet Take 1 tablet (10 mg total) by mouth at bedtime. 90 tablet 3   aspirin  EC 81 MG tablet Take 81 mg by mouth at bedtime. Swallow whole.     atorvastatin  (LIPITOR) 40 MG tablet Take 1 tablet (40 mg total) by mouth daily. 30 tablet 11   clopidogrel  (PLAVIX ) 75 MG tablet Take 1 tablet (75 mg total) by mouth daily with breakfast. 90 tablet 1   ezetimibe  (ZETIA ) 10 MG tablet Take 1 tablet (10 mg total) by mouth daily. 30 tablet 11   metoprolol  succinate (TOPROL -XL) 25 MG 24 hr tablet Take 1 tablet (25 mg total) by mouth daily. Take with or immediately following a meal. (Patient taking differently: Take 25 mg by mouth daily with supper. Take with or immediately following a meal.) 30 tablet 3   nitroGLYCERIN  (NITROSTAT ) 0.4 MG SL tablet Place 1 tablet (0.4 mg total) under the tongue every 5 (five) minutes as needed for chest pain. The proper use and anticipated side effects of nitroglycerine has been carefully explained.  If a single episode of chest pain is not relieved by one tablet, the patient will try another within 5 minutes; and if this doesn't relieve the pain, the patient is  instructed to call 911 for transportation to an emergency department. 30 tablet 3   No current facility-administered medications on file prior to visit.  [2] No Known Allergies

## 2024-03-22 ENCOUNTER — Ambulatory Visit

## 2024-03-22 ENCOUNTER — Other Ambulatory Visit (HOSPITAL_COMMUNITY): Payer: Self-pay

## 2024-03-22 ENCOUNTER — Telehealth: Payer: Self-pay

## 2024-03-22 ENCOUNTER — Telehealth: Payer: Self-pay | Admitting: Pharmacy Technician

## 2024-03-22 DIAGNOSIS — E7800 Pure hypercholesterolemia, unspecified: Secondary | ICD-10-CM

## 2024-03-22 NOTE — Assessment & Plan Note (Addendum)
 Assessment:  LDL goal: < 70 mg/dl; last LDLc 876 mg/dl (05/7972) prior to statin and ezetimibe  initiation Lpa (02/2024): 157.4 Intolerance to rosuvastatin  (myalgias); reports muscle pain still bothersome after switching to atorvastatin ; reports no issues related to ezetimibe    Discussed next potential options (PCSK-9 inhibitors, bempedoic acid and inclisiran); cost, dosing efficacy, side effects  Patient interested in proceeding with PCSK9i therapy and would like to replace statin due to myalgias Encouraged patient to continue heart healthy diet and attend cardiac rehab as scheduled  Plan: Continue taking atorvastatin  40 mg daily and ezetimibe  10 mg daily Will apply for PA for PCSK9i; will inform patient upon approval  Tentative plan to replace statin with PCSK9i if approved  Instructed patient to obtain lipid panel and hepatic panel in 1-2 weeks and bring results to f/u appointment with provider on 04/06/24 - he will obtain at [facility] primary care Lipid panel and Lpa in 3 months after starting PCSK9i; patient to receive labs at [facility] primary care

## 2024-03-22 NOTE — Telephone Encounter (Signed)
" ° °  Ran test claim for repatha. For a 28 day supply and the co-pay is 35.00 . PA is not needed at this time. This test claim was processed through [facility]- copay amounts may vary at other pharmacies due to pharmacy/plan contracts, or as the patient moves through the different stages of their insurance plan.    "

## 2024-03-22 NOTE — Patient Instructions (Addendum)
 Your Results:             Your most recent labs Goal  Total Cholesterol 200 < 200  Triglycerides 254 < 150  HDL (happy/good cholesterol) 32 > 40  LDL (lousy/bad cholesterol 123 < 70  Lpa  157.4 <75    Medication changes: Continue atorvastatin  40 mg daily and ezetimibe  10 mg daily We will start the process to get Repatha or Praluent covered by your insurance.  Once the prior authorization is complete, we will call you to let you know and confirm pharmacy information.    Lab orders: Please obtain hepatic panel and lipid panel  We want to repeat labs 3 months after starting PCSK9i.  We will send you a lab order to remind you once we get closer to that time.  You will get those from lipid panel  Zelta Enfield E. Krist Rosenboom, Pharm.D, CPP [place] [facility]. [facility] [address], [place], [place] [address] Phone: [phone]; Fax: [phone]     Praluent is a cholesterol medication that improved your body's ability to get rid of bad cholesterol known as LDL. It can lower your LDL up to 60%. It is an injection that is given under the skin every 2 weeks. The most common side effects of Praluent include runny nose, symptoms of the common cold, rarely flu or flu-like symptoms, back/muscle pain in about 3-4% of the patients, and redness, pain, or bruising at the injection site.  Repatha is a cholesterol medication that improved your body's ability to get rid of bad cholesterol known as LDL. It can lower your LDL up to 60%! It is an injection that is given under the skin every 2 weeks. The medication often requires a prior authorization from your insurance company. We will take care of submitting all the necessary information to your insurance company to get it approved. The most common side effects of Repatha include runny nose, symptoms of the common cold, rarely flu or flu-like symptoms, back/muscle pain in about 3-4% of the patients, and redness, pain, or bruising  at the injection site.   It is also recommended that patients with high cholesterol adhere to a heart healthy diet, get regular exercise, avoid use of tobacco products, and maintain a healthy weight. Steps that you can take to help in these areas:  Limit consumption of trans fats, saturated fats, and cholesterol in your diet  Increase intake of lean meats such as chicken, turkey, and fish  Increase intake of foods rich in fiber such as fresh fruits, vegetables, beans and oatmeal Exercise as you are able; even 30 minutes of walking daily can aid in increasing heart health

## 2024-03-23 MED ORDER — REPATHA SURECLICK 140 MG/ML ~~LOC~~ SOAJ: 140.0000 mg | SUBCUTANEOUS | 2 refills | Status: AC

## 2024-03-23 NOTE — Telephone Encounter (Signed)
 Spoke with patient and discussed repatha  copay. He is agreeable to cost and I will send rx to [facility]. He will still obtain hepatic panel and lipid panel next week to provide baseline prior to starting PCSK9i . Instructed patient that PCSK9i will replace statin due to bothersome myalgias. Patient verbalized understanding.

## 2024-04-06 ENCOUNTER — Ambulatory Visit: Attending: Physician Assistant | Admitting: Physician Assistant

## 2024-04-06 ENCOUNTER — Encounter: Payer: Self-pay | Admitting: Physician Assistant

## 2024-04-06 VITALS — BP 134/88 | HR 89 | Ht 69.0 in | Wt 224.2 lb

## 2024-04-06 DIAGNOSIS — I2510 Atherosclerotic heart disease of native coronary artery without angina pectoris: Secondary | ICD-10-CM | POA: Diagnosis not present

## 2024-04-06 DIAGNOSIS — E7800 Pure hypercholesterolemia, unspecified: Secondary | ICD-10-CM | POA: Diagnosis not present

## 2024-04-06 NOTE — Patient Instructions (Signed)
 Medication Instructions:  LIPITOR HAS BEEN DISCONTINUED  *If you need a refill on your cardiac medications before your next appointment, please call your pharmacy*   Follow-Up: At [facility], you and your health needs are our priority.  As part of our continuing mission to provide you with exceptional heart care, our providers are all part of one team.  This team includes your primary Cardiologist (physician) and Advanced Practice Providers or APPs (Physician Assistants and Nurse Practitioners) who all work together to provide you with the care you need, when you need it.  Your next appointment:   4 month(s)  Provider:   [name], MD    We recommend signing up for the patient portal called MyChart.  Sign up information is provided on this After Visit Summary.  MyChart is used to connect with patients for Virtual Visits (Telemedicine).  Patients are able to view lab/test results, encounter notes, upcoming appointments, etc.  Non-urgent messages can be sent to your provider as well.   To learn more about what you can do with MyChart, go to [email or web address].

## 2024-04-06 NOTE — Progress Notes (Signed)
 " Cardiology Office Note   Date:  04/06/2024  ID:  Tyler Mcgrath, DOB 09/28/1975, MRN [id number] PCP: Allen, Chad, NP  [facility] Providers Cardiologist:  [name], MD     History of Present Illness Tyler Mcgrath is a 49 y.o. male with past medical history of elevated coronary calcium  score and a family history of CAD who was referred to Dr. Fell for evaluation of chest discomfort.  Recent coronary calcium  scoring test obtained on 02/08/2024 showed coronary calcium  score 427 which placed the patient at 98th percentile for age and sex matched control.  He also described chest discomfort during exertion along with dyspnea and fatigue.  Dr. Fell recommended proceed with cardiac catheterization for definitive evaluation.  Patient ultimately underwent cardiac catheterization on 03/02/2024 that showed 95% ostial to proximal LAD lesion treated with a 3.0 x 28 mm Synergy DES, 40% OM1, 40% OM 2, 50% D1, 75% D2, 50% mid LAD, 40% mid RCA and 50% distal RCA lesion.  Postprocedure, patient was placed on aspirin  and Plavix  with recommendation to continue dual antiplatelet therapy for a minimum of 6 months.  Medical therapy was recommended for residual disease.  His lipoprotein a was 157.4 which was quite elevated.  Patient was initially discharged on atorvastatin  and Zetia , repeat blood work showed cholesterol 95, LDL 37.8, HDL 29, triglyceride 141.  After discussing with our clinical pharmacist, atorvastatin  was discontinued and patient was started on Repatha .  He presents today for posthospital follow-up.  He has been doing well without any exertional chest pain or shortness of breath.  EKG today was normal.  He has no lower extremity edema, orthopnea or PND.  We reviewed the hospital record.  He is aware that he will need aspirin  lifelong and Plavix  for minimum 6 months, but potentially 1 year depending on the cardiologist preference.  He is getting back to the gym to exercise 5 days a week.  He  may shorten his cardiac rehab if he is doing more exercise at the gym.  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  He can follow-up with Dr. Fell in 3 to 4 months.   ROS:   He denies chest pain, palpitations, dyspnea, pnd, orthopnea, n, v, dizziness, syncope, edema, weight gain, or early satiety. All other systems reviewed and are otherwise negative except as noted above.    Studies Reviewed      Cardiac Studies & Procedures   ______________________________________________________________________________________________ CARDIAC CATHETERIZATION  CARDIAC CATHETERIZATION 03/02/2024  Conclusion   Ost LAD to Prox LAD lesion is 95% stenosed.   1st Mrg lesion is 40% stenosed.   2nd Mrg lesion is 40% stenosed.   1st Diag lesion is 50% stenosed.   2nd Diag lesion is 75% stenosed.   Mid LAD lesion is 50% stenosed.   Mid RCA lesion is 40% stenosed.   Dist RCA lesion is 50% stenosed.   A drug-eluting stent was successfully placed using a STENT SYNERGY XD 3.0X28.   Post intervention, there is a 0% residual stenosis.   The left ventricular systolic function is normal.   LV end diastolic pressure is normal.   The left ventricular ejection fraction is 55-65% by visual estimate.   Recommend uninterrupted dual antiplatelet therapy with Aspirin  81mg  daily and Clopidogrel  75mg  daily for a minimum of 6 months (stable ischemic heart disease-Class I recommendation).  1.  Severe proximal LAD stenosis, treated successfully with PCI using a 3.0 x 28 mm Synergy DES  2.  Diffuse nonobstructive coronary artery disease involving the diagonal branches of the LAD, mid to distal LAD, circumflex, and RCA 3.  Normal LV function and normal LVEDP  Recommendations: Overnight observation due to the timing of procedure late in the day.  DAPT with aspirin  and clopidogrel  times minimum 6 months.  Aggressive risk reduction measures.  Medical therapy for residual  CAD.  Findings Coronary Findings Diagnostic  Dominance: Right  Left Anterior Descending There is mild diffuse disease throughout the vessel. Ost LAD to Prox LAD lesion is 95% stenosed. Mid LAD lesion is 50% stenosed.  First Diagonal Branch 1st Diag lesion is 50% stenosed.  Second Diagonal Branch Vessel is small in size. 2nd Diag lesion is 75% stenosed.  Left Circumflex There is mild diffuse disease throughout the vessel.  First Obtuse Marginal Branch The circumflex is patent.  There is mild diffuse plaquing present.  There are 3 obtuse marginal branches with mild ostial stenoses but no high-grade obstruction. 1st Mrg lesion is 40% stenosed.  Second Obtuse Marginal Branch 2nd Mrg lesion is 40% stenosed.  Right Coronary Artery There is mild diffuse disease throughout the vessel. Mid RCA lesion is 40% stenosed. Dist RCA lesion is 50% stenosed.  Intervention  Ost LAD to Prox LAD lesion Stent CATH LAUNCHER 67F EBU3.5 guide catheter was inserted. Lesion crossed with guidewire using a WIRE RUNTHROUGH .[id number]. Pre-stent angioplasty was performed. A drug-eluting stent was successfully placed using a STENT SYNERGY XD 3.0X28. Post-stent angioplasty was performed using a BALLOON [place] EMERGE MR 3.25X15. Maximum pressure:  18 atm. Post-Intervention Lesion Assessment The intervention was successful. Pre-interventional TIMI flow is 3. Post-intervention TIMI flow is 3. No complications occurred at this lesion. There is a 0% residual stenosis post intervention.          CT SCANS  CT CARDIAC SCORING (SELF PAY ONLY) 02/08/2024  Addendum 02/11/2024  3:19 AM ADDENDUM REPORT: 02/11/2024 03:17  EXAM: OVER-READ INTERPRETATION  CT CHEST  The following report is an over-read performed by radiologist Dr. Oneil Devonshire of [facility] on 02/11/2024. This over-read does not include interpretation of cardiac or coronary anatomy or pathology. The coronary calcium  score  interpretation by the cardiologist is attached.  COMPARISON:  None.  FINDINGS: Cardiovascular: There are no significant extracardiac vascular findings.  Mediastinum/Nodes: There are no enlarged lymph nodes within the visualized mediastinum.  Lungs/Pleura: There is no pleural effusion. The visualized lungs appear clear.  Upper abdomen: No significant findings in the visualized upper abdomen.  Musculoskeletal/Chest wall: No chest wall mass or suspicious osseous findings within the visualized chest.  IMPRESSION: No significant extracardiac findings within the visualized chest.   Electronically Signed By: Oneil Devonshire M.D. On: 02/11/2024 03:17  Narrative : CLINICAL DATA:  Cardiovascular Disease Risk stratification  EXAM:  Coronary Calcium  Score  TECHNIQUE:  A gated, non-contrast computed tomography scan of the heart was  performed using 3mm slice thickness. Axial images were analyzed on a  dedicated workstation. Calcium  scoring of the coronary arteries was  performed using the Agatston method.  FINDINGS:  Coronary Calcium  Score:  Left main: 0  Left anterior descending artery: 291  Left circumflex artery: 22  Right coronary artery: 115  Total: 427  Pericardium: Normal.  Ascending Aorta: Normal caliber.  Pulmonary artery: Normal caliber  Non-cardiac: See separate report from [facility].  IMPRESSION:  Coronary calcium  score of 427. This was 48 percentile for age-, race-,  and sex-matched controls.  RECOMMENDATIONS:  Coronary artery calcium  (CAC) score is a strong  predictor of  incident coronary heart disease (CHD) and provides predictive  information beyond traditional risk factors. CAC scoring is  reasonable to use in the decision to withhold, postpone, or initiate  statin therapy in intermediate-risk or selected borderline-risk  asymptomatic adults (age 5-75 years and LDL-C >=70 to <190 mg/dL)  who do not have diabetes or  established atherosclerotic  cardiovascular disease (ASCVD).* In intermediate-risk (10-year ASCVD  risk >=7.5% to <20%) adults or selected borderline-risk (10-year  ASCVD risk >=5% to <7.5%) adults in whom a CAC score is measured for  the purpose of making a treatment decision the following  recommendations have been made:  If CAC=0, it is reasonable to withhold statin therapy and reassess  in 5 to 10 years, as long as higher risk conditions are absent  (diabetes mellitus, family history of premature CHD in first degree  relatives (males <55 years; females <65 years), cigarette smoking,  or LDL >=190 mg/dL).  If CAC is 1 to 99, it is reasonable to initiate statin therapy for  patients >=77 years of age.  If CAC is >=100 or >=75th percentile, it is reasonable to initiate  statin therapy at any age.  Cardiology referral should be considered for patients with CAC  scores >=400 or >=75th percentile.  *2018 AHA/ACC/AACVPR/AAPA/ABC/ACPM/ADA/AGS/APhA/ASPC/NLA/PCNA  Guideline on the Management of Blood Cholesterol: A Report of the  American College of Cardiology/American Heart Association Task Force  on Clinical Practice Guidelines. J Am Coll Cardiol.  2019;73(24):3168-3209.  Electronically Signed: By: Lamar Fitch M.D. On: 02/09/2024 17:46     ______________________________________________________________________________________________      Risk Assessment/Calculations           Physical Exam VS:  BP 134/88 (BP Location: Left Arm, Patient Position: Sitting, Cuff Size: Large)   Pulse 89   Ht 5' 9 (1.753 m)   Wt 224 lb 3.2 oz (101.7 kg)   SpO2 100%   BMI 33.11 kg/m        Wt Readings from Last 3 Encounters:  04/06/24 224 lb 3.2 oz (101.7 kg)  03/02/24 224 lb (101.6 kg)  02/28/24 227 lb 9.6 oz (103.2 kg)    GEN: Well nourished, well developed in no acute distress NECK: No JVD; No carotid bruits CARDIAC: RRR, no murmurs, rubs,  gallops RESPIRATORY:  Clear to auscultation without rales, wheezing or rhonchi  ABDOMEN: Soft, non-tender, non-distended EXTREMITIES:  No edema; No deformity   ASSESSMENT AND PLAN  CAD: Recent cardiac catheterization showed multivessel disease with most significant lesion being the proximal LAD treated with drug-eluting stent.  emphasis has been placed on compliance with dual antiplatelet therapy.  He will be on aspirin  for life, he needs Plavix  for minimum 6 months.  Hyperlipidemia: Recent lipid panel on atorvastatin  and Zetia  showed total cholesterol 95, LDL 37.8, HDL 29 and triglyceride 141.  Atorvastatin  was discontinued and he was switched to Repatha .  He is tolerating Repatha  without any issue.       Dispo: Follow-up with Dr. Wonda in 3 to 4 months  Signed, Regine Christian, PA  "

## 2024-04-13 ENCOUNTER — Telehealth: Payer: Self-pay

## 2024-04-13 NOTE — Telephone Encounter (Signed)
-----   Message from [place] B sent at 04/06/2024 10:39 AM EST ----- Regarding: 89m f/u Please sched pt for a 58m f/u w/Dr. Wonda, per today's AVS w/H.Meng.  I do not see any availability on my end through May or June, as he is not a New Patient nor can we schedule for Structural Heart.  Pt can be contacted at: [phone].  Thanks, JB

## 2024-04-13 NOTE — Telephone Encounter (Signed)
 Attempted to call pt, unable to reach. LMTCB to schedule 4 month f/u appt with Dr. Wonda.

## 2024-07-31 ENCOUNTER — Ambulatory Visit: Admitting: Cardiovascular Disease
